# Patient Record
Sex: Male | Born: 1965 | Race: White | Hispanic: No | Marital: Married | Smoking: Never smoker
Health system: Southern US, Community
[De-identification: ages and names within clinical notes are randomized; demographics above are authoritative.]

## PROBLEM LIST (undated history)

## (undated) DIAGNOSIS — Z8489 Family history of other specified conditions: Secondary | ICD-10-CM

## (undated) DIAGNOSIS — Z8619 Personal history of other infectious and parasitic diseases: Secondary | ICD-10-CM

## (undated) DIAGNOSIS — K219 Gastro-esophageal reflux disease without esophagitis: Secondary | ICD-10-CM

## (undated) DIAGNOSIS — M1990 Unspecified osteoarthritis, unspecified site: Secondary | ICD-10-CM

## (undated) DIAGNOSIS — J45909 Unspecified asthma, uncomplicated: Secondary | ICD-10-CM

## (undated) HISTORY — PX: WISDOM TOOTH EXTRACTION: SHX21

## (undated) HISTORY — DX: Personal history of other infectious and parasitic diseases: Z86.19

## (undated) HISTORY — DX: Unspecified osteoarthritis, unspecified site: M19.90

---

## 2012-04-24 ENCOUNTER — Encounter: Payer: Self-pay | Admitting: Internal Medicine

## 2012-04-24 ENCOUNTER — Ambulatory Visit (INDEPENDENT_AMBULATORY_CARE_PROVIDER_SITE_OTHER): Payer: Managed Care, Other (non HMO) | Admitting: Internal Medicine

## 2012-04-24 VITALS — BP 100/68 | HR 69 | Temp 98.0°F | Resp 18 | Ht 76.25 in | Wt 208.0 lb

## 2012-04-24 DIAGNOSIS — Z809 Family history of malignant neoplasm, unspecified: Secondary | ICD-10-CM

## 2012-04-24 DIAGNOSIS — Z0000 Encounter for general adult medical examination without abnormal findings: Secondary | ICD-10-CM

## 2012-04-24 DIAGNOSIS — R109 Unspecified abdominal pain: Secondary | ICD-10-CM

## 2012-04-24 LAB — LIPID PANEL
Cholesterol: 180 mg/dL (ref 0–200)
LDL Cholesterol: 120 mg/dL — ABNORMAL HIGH (ref 0–99)
Total CHOL/HDL Ratio: 5.5 Ratio
Triglycerides: 134 mg/dL (ref <150)
VLDL: 27 mg/dL (ref 0–40)

## 2012-04-24 LAB — HEPATIC FUNCTION PANEL
ALT: 13 U/L (ref 0–53)
Bilirubin, Direct: 0.1 mg/dL (ref 0.0–0.3)
Indirect Bilirubin: 0.3 mg/dL (ref 0.0–0.9)
Total Bilirubin: 0.4 mg/dL (ref 0.3–1.2)

## 2012-04-24 LAB — BASIC METABOLIC PANEL
BUN: 13 mg/dL (ref 6–23)
Chloride: 106 mEq/L (ref 96–112)
Creat: 1.04 mg/dL (ref 0.50–1.35)
Potassium: 4.2 mEq/L (ref 3.5–5.3)

## 2012-04-24 LAB — CBC WITH DIFFERENTIAL/PLATELET
Basophils Absolute: 0.1 10*3/uL (ref 0.0–0.1)
Basophils Relative: 1 % (ref 0–1)
Eosinophils Relative: 3 % (ref 0–5)
Lymphocytes Relative: 30 % (ref 12–46)
MCHC: 33.4 g/dL (ref 30.0–36.0)
Neutro Abs: 3.6 10*3/uL (ref 1.7–7.7)
Platelets: 254 10*3/uL (ref 150–400)
RDW: 14 % (ref 11.5–15.5)
WBC: 6.7 10*3/uL (ref 4.0–10.5)

## 2012-04-25 LAB — URINALYSIS, ROUTINE W REFLEX MICROSCOPIC
Bilirubin Urine: NEGATIVE
Glucose, UA: NEGATIVE mg/dL
Hgb urine dipstick: NEGATIVE
Ketones, ur: NEGATIVE mg/dL
Protein, ur: NEGATIVE mg/dL
Urobilinogen, UA: 0.2 mg/dL (ref 0.0–1.0)

## 2012-05-05 DIAGNOSIS — Z809 Family history of malignant neoplasm, unspecified: Secondary | ICD-10-CM | POA: Insufficient documentation

## 2012-05-05 DIAGNOSIS — Z0000 Encounter for general adult medical examination without abnormal findings: Secondary | ICD-10-CM | POA: Insufficient documentation

## 2012-05-05 DIAGNOSIS — R109 Unspecified abdominal pain: Secondary | ICD-10-CM | POA: Insufficient documentation

## 2012-05-05 NOTE — Assessment & Plan Note (Signed)
Family hx suggestive of MEN?2. Close follow up recommended and will further discuss possible endocrinology consult.

## 2012-05-05 NOTE — Progress Notes (Signed)
  Subjective:    Patient ID: Ronald Rios, male    DOB: February 11, 1966, 46 y.o.   MRN: [id number]  HPI Pt presents to clinic for physical. Notes 2wk h/o left abdominal pain improved by lying down. No radiation, fever, chills, n/v or blood in stool. Has family h/o including mother with pheochromocytoma and sister with thyroid and parathyroid cancer.   Past Medical History  Diagnosis Date  . History of chicken pox     childhood   Past Surgical History  Procedure Date  . No past surgeries 04/24/2012    per patient report    reports that he has never smoked. He has never used smokeless tobacco. He reports that he drinks alcohol. He reports that he does not use illicit drugs. family history includes Cancer in an unspecified family member; Diabetes in his father; Hypertension in his mother; Multiple sclerosis in his sister; Prostate cancer (age of onset:78) in his father; and Stroke in his mother.  There is no history of Heart disease, and Colon cancer, and Breast cancer, . No Known Allergies   Review of Systems  Gastrointestinal: Positive for abdominal pain. Negative for nausea, vomiting, diarrhea, constipation, blood in stool and abdominal distention.  All other systems reviewed and are negative.       Objective:   Physical Exam  Physical Exam  Nursing note and vitals reviewed. Constitutional: He appears well-developed and well-nourished. No distress.  HENT:  Head: Normocephalic and atraumatic.  Right Ear: Tympanic membrane and external ear normal.  Left Ear: Tympanic membrane and external ear normal.  Nose: Nose normal.  Mouth/Throat: Uvula is midline, oropharynx is clear and moist and mucous membranes are normal. No oropharyngeal exudate.  Eyes: Conjunctivae and EOM are normal. Pupils are equal, round, and reactive to light. Right eye exhibits no discharge. Left eye exhibits no discharge. No scleral icterus.  Neck: Neck supple. Carotid bruit is not present. No thyromegaly present.    Cardiovascular: Normal rate, regular rhythm and normal heart sounds.  Exam reveals no gallop and no friction rub.   No murmur heard. Pulmonary/Chest: Effort normal and breath sounds normal. No respiratory distress. He has no wheezes. He has no rales.  Abdominal: Soft. He exhibits no distension and no mass. There is no hepatosplenomegaly. Mild tenderness of llq without rebound, guarding or rigidity. There is no rebound. Hernia confirmed negative in the right inguinal area and confirmed negative in the left inguinal area.  Genitourinary: Rectum normal, prostate normal and testes normal. Rectal exam shows no mass and no tenderness. Guaiac negative stool. Prostate is not enlarged and not tender.Lymphadenopathy:    He has no cervical adenopathy.  Neurological: He is alert.  Skin: Skin is warm and dry. He is not diaphoretic.  Psychiatric: He has a normal mood and affect.        Assessment & Plan:

## 2012-05-05 NOTE — Assessment & Plan Note (Signed)
Close f/u in 2wks or sooner if needed. If sx's persist pursue abd ct vs GI consult

## 2012-05-05 NOTE — Assessment & Plan Note (Signed)
Obtain cpe labs. Given hemoccult cards x3 to complete.

## 2012-07-12 ENCOUNTER — Encounter (HOSPITAL_BASED_OUTPATIENT_CLINIC_OR_DEPARTMENT_OTHER): Payer: Self-pay

## 2012-07-12 ENCOUNTER — Ambulatory Visit (INDEPENDENT_AMBULATORY_CARE_PROVIDER_SITE_OTHER): Payer: Managed Care, Other (non HMO) | Admitting: Internal Medicine

## 2012-07-12 ENCOUNTER — Ambulatory Visit (HOSPITAL_BASED_OUTPATIENT_CLINIC_OR_DEPARTMENT_OTHER)
Admission: RE | Admit: 2012-07-12 | Discharge: 2012-07-12 | Disposition: A | Discharge status: Home / Self Care | Payer: Managed Care, Other (non HMO) | Source: Ambulatory Visit | Attending: Internal Medicine | Admitting: Internal Medicine

## 2012-07-12 ENCOUNTER — Encounter: Payer: Self-pay | Admitting: Internal Medicine

## 2012-07-12 ENCOUNTER — Telehealth: Payer: Self-pay | Admitting: Gastroenterology

## 2012-07-12 VITALS — BP 119/77 | HR 68 | Temp 98.1°F | Resp 16 | Wt 208.2 lb

## 2012-07-12 DIAGNOSIS — R109 Unspecified abdominal pain: Secondary | ICD-10-CM

## 2012-07-12 DIAGNOSIS — R52 Pain, unspecified: Secondary | ICD-10-CM | POA: Insufficient documentation

## 2012-07-12 DIAGNOSIS — M47817 Spondylosis without myelopathy or radiculopathy, lumbosacral region: Secondary | ICD-10-CM | POA: Insufficient documentation

## 2012-07-12 DIAGNOSIS — R1032 Left lower quadrant pain: Secondary | ICD-10-CM | POA: Insufficient documentation

## 2012-07-12 DIAGNOSIS — M4120 Other idiopathic scoliosis, site unspecified: Secondary | ICD-10-CM | POA: Insufficient documentation

## 2012-07-12 MED ORDER — IOHEXOL 300 MG/ML  SOLN
100.0000 mL | Freq: Once | INTRAMUSCULAR | Status: AC | PRN
  Administered 2012-07-12: 100 mL via INTRAVENOUS

## 2012-07-12 NOTE — Telephone Encounter (Signed)
Ronald Rios states Dr Rodena Medin wants pt seen asap for llq pain; CT done today that was basically unremarkable. Pt given an appt with Willette Cluster, NP in am.

## 2012-07-12 NOTE — Assessment & Plan Note (Signed)
Proceed with abd/pelvic ct. Consider GI consult if imaging unrevealing. Present to ED with worsening pain.

## 2012-07-12 NOTE — Progress Notes (Signed)
  Subjective:    Patient ID: Ronald Rios, male    DOB: [birth year], 46 y.o.   MRN: [id number]  HPI Pt presents to clinic for evaluation of abdominal pain. Notes continued LLQ pain recently worsening in severity over last one week. No radiating pain. No associated n/v, blood in stool, f/c or change in bowel habits. No alleviating or exacerbating factors. Taking no medication for the problem.  Past Medical History  Diagnosis Date  . History of chicken pox     childhood   Past Surgical History  Procedure Date  . No past surgeries 04/24/2012    per patient report    reports that he has never smoked. He has never used smokeless tobacco. He reports that he drinks alcohol. He reports that he does not use illicit drugs. family history includes Cancer in an unspecified family member; Diabetes in his father; Hypertension in his mother; Multiple sclerosis in his sister; Prostate cancer (age of onset:78) in his father; and Stroke in his mother.  There is no history of Heart disease, and Colon cancer, and Breast cancer, . No Known Allergies   Review of Systems  See hpi     Objective:   Physical Exam  Nursing note and vitals reviewed. Constitutional: He appears well-developed and well-nourished. No distress.  HENT:  Head: Normocephalic and atraumatic.  Eyes: Conjunctivae are normal. No scleral icterus.  Abdominal: Soft. Normal appearance and bowel sounds are normal. He exhibits no distension and no mass. There is no hepatosplenomegaly. There is tenderness in the left lower quadrant. There is no rebound and no guarding.    Skin: He is not diaphoretic.          Assessment & Plan:

## 2012-07-13 ENCOUNTER — Ambulatory Visit (INDEPENDENT_AMBULATORY_CARE_PROVIDER_SITE_OTHER): Payer: Managed Care, Other (non HMO) | Admitting: Nurse Practitioner

## 2012-07-13 ENCOUNTER — Encounter: Payer: Self-pay | Admitting: Nurse Practitioner

## 2012-07-13 ENCOUNTER — Telehealth: Payer: Self-pay | Admitting: *Deleted

## 2012-07-13 VITALS — BP 120/70 | HR 64 | Ht 76.0 in | Wt 210.8 lb

## 2012-07-13 DIAGNOSIS — M7918 Myalgia, other site: Secondary | ICD-10-CM | POA: Insufficient documentation

## 2012-07-13 DIAGNOSIS — IMO0001 Reserved for inherently not codable concepts without codable children: Secondary | ICD-10-CM

## 2012-07-13 LAB — POC HEMOCCULT BLD/STL (HOME/3-CARD/SCREEN): Card #1 Date: NEGATIVE

## 2012-07-13 MED ORDER — METHOCARBAMOL 500 MG PO TABS: ORAL_TABLET | ORAL | Status: DC

## 2012-07-13 NOTE — Telephone Encounter (Signed)
i know he will be traveling so ok to cancel but stay in touch with status

## 2012-07-13 NOTE — Patient Instructions (Addendum)
We sent a prescription for Robaxin to [facility]. Use a heating pad 2-3 times a day.  Your stool card test was negative per Dr. Rodena Medin. You do not need a Colonoscopy at this time.

## 2012-07-13 NOTE — Addendum Note (Signed)
Addended by: Regis Bill on: 07/13/2012 10:25 AM   Modules accepted: Orders

## 2012-07-13 NOTE — Progress Notes (Signed)
07/13/2012 Ronald Rios [id number] 12-Apr-1966   HISTORY OF PRESENT ILLNESS: Patient is a 46 year old male, new to this practice, here for evaluation of left lower quadrant pain. Patient gives a 2 month history of left lower quadrant pain which is described as constant, dull in nature. It is worse with movements such as lifting left leg. Pain not related to meating. Patient sleeps well at night, the pain does not disturb him. Pain not related to bowel movements and his bowel movements are normal. No overt GI blood loss but patient tells me he just submitted hemoccult cards to PCP. No weight loss, no nausea or vomiting. No family history of colon cancer. Patient does have other cancers in his family however. No significant GERD symptoms only occasional heartburn.   Past Medical History  Diagnosis Date  . History of chicken pox     childhood  . Arthritis    Past Surgical History  Procedure Date  . No past surgeries 04/24/2012    per patient report    reports that he has never smoked. He has never used smokeless tobacco. He reports that he drinks alcohol. He reports that he does not use illicit drugs. family history includes Cancer in his mother and sister; Diabetes in his father; Hypertension in his mother; Multiple sclerosis in his sister; Prostate cancer (age of onset:78) in his father; and Stroke in his mother.  There is no history of Heart disease, and Colon cancer, and Breast cancer, . No Known Allergies    No outpatient encounter prescriptions on file as of 07/13/2012.   Facility-Administered Encounter Medications as of 07/13/2012  Medication Dose Route Frequency Provider Last Rate Last Dose  . iohexol (OMNIPAQUE) 300 MG/ML solution 100 mL  100 mL Intravenous Once PRN Medication Radiologist, MD   100 mL at 07/12/12 1349     REVIEW OF SYSTEMS  : All other systems reviewed and negative except where noted in the History of Present Illness.   PHYSICAL EXAM: BP 120/70  Pulse 64  Ht 6\' 4"   (1.93 m)  Wt 210 lb 12.8 oz (95.618 kg)  BMI 25.66 kg/m2 General: Well developed white male in no acute distress Head: Normocephalic and atraumatic Eyes:  sclerae anicteric,conjunctive pink. Ears: Normal auditory acuity Mouth: No deformity or lesions Neck: Supple, no masses.  Lungs: Clear throughout to auscultation Heart: Regular rate and rhythm; no murmurs heard Abdomen: Soft, non distended, nontender. Positive Carnett's sign. No masses or hepatomegaly noted. Normal Bowel sounds Rectal: not done. Hemoccults negative at PCP office Musculoskeletal: Symmetrical with no gross deformities  Skin: No lesions on visible extremities Extremities: No edema or deformities noted Neurological: Alert oriented x 4, grossly nonfocal Cervical Nodes:  No significant cervical adenopathy Psychological:  Alert and cooperative. Normal mood and affect  ASSESSMENT AND PLAN:  46 year old male with a two-month history of left lower quadrant pain, worse over the last couple of weeks. Pain dull but constant during waking hours. Pain definitely exacerbated by physical activity involving LLQ, especially lifting left lower extremity. He has a positive Carnett's sign on exam. No alarm features such as bowel changes, blood in stool, anemia or weight loss. We called PCP's office, his Hemoccults are negative.   Will try a course of muscle relaxers and heating pad. Of note a CT scan of the abdomen and pelvis with contrast done for evaluation of this pain was negative. Patient will let us know in a few days how he is doing.

## 2012-07-13 NOTE — Telephone Encounter (Signed)
Notified pt of normal CT result. He reports that he saw GI this morning and they determined that he has a muscular tear in his abdomen and have advised him to take it easy for a while. Pt wants to know if he needs to keep his follow up with [place] on 07/20/12? Please advise.

## 2012-07-13 NOTE — Addendum Note (Signed)
Addended by: Regis Bill on: 07/13/2012 10:27 AM   Modules accepted: Orders

## 2012-07-16 ENCOUNTER — Encounter: Payer: Self-pay | Admitting: Nurse Practitioner

## 2012-07-16 NOTE — Telephone Encounter (Signed)
Notified pt and voices understanding. States he will let us know if he is not feeling better within 1 week.

## 2012-07-18 NOTE — Progress Notes (Signed)
Agree with Ms. Guenther's assessment and plan. Srija Southard E. Viola Placeres, MD, FACG   

## 2012-07-20 ENCOUNTER — Ambulatory Visit: Payer: Managed Care, Other (non HMO) | Admitting: Internal Medicine

## 2012-08-09 ENCOUNTER — Ambulatory Visit: Payer: Managed Care, Other (non HMO) | Admitting: Gastroenterology

## 2013-05-11 ENCOUNTER — Encounter (HOSPITAL_COMMUNITY): Payer: Self-pay | Admitting: Physical Medicine and Rehabilitation

## 2013-05-11 ENCOUNTER — Emergency Department (HOSPITAL_COMMUNITY): Payer: Managed Care, Other (non HMO)

## 2013-05-11 ENCOUNTER — Emergency Department (HOSPITAL_COMMUNITY)
Admission: EM | Admit: 2013-05-11 | Discharge: 2013-05-11 | Disposition: A | Discharge status: Home / Self Care | Payer: Managed Care, Other (non HMO) | Attending: Emergency Medicine | Admitting: Emergency Medicine

## 2013-05-11 DIAGNOSIS — T07XXXA Unspecified multiple injuries, initial encounter: Secondary | ICD-10-CM | POA: Insufficient documentation

## 2013-05-11 DIAGNOSIS — Y998 Other external cause status: Secondary | ICD-10-CM | POA: Insufficient documentation

## 2013-05-11 DIAGNOSIS — S4980XA Other specified injuries of shoulder and upper arm, unspecified arm, initial encounter: Secondary | ICD-10-CM | POA: Insufficient documentation

## 2013-05-11 DIAGNOSIS — Y939 Activity, unspecified: Secondary | ICD-10-CM | POA: Insufficient documentation

## 2013-05-11 DIAGNOSIS — Y9289 Other specified places as the place of occurrence of the external cause: Secondary | ICD-10-CM | POA: Insufficient documentation

## 2013-05-11 DIAGNOSIS — S4991XA Unspecified injury of right shoulder and upper arm, initial encounter: Secondary | ICD-10-CM

## 2013-05-11 DIAGNOSIS — Z791 Long term (current) use of non-steroidal anti-inflammatories (NSAID): Secondary | ICD-10-CM | POA: Insufficient documentation

## 2013-05-11 DIAGNOSIS — Z79899 Other long term (current) drug therapy: Secondary | ICD-10-CM | POA: Insufficient documentation

## 2013-05-11 DIAGNOSIS — S46909A Unspecified injury of unspecified muscle, fascia and tendon at shoulder and upper arm level, unspecified arm, initial encounter: Secondary | ICD-10-CM | POA: Insufficient documentation

## 2013-05-11 MED ORDER — HYDROCODONE-ACETAMINOPHEN 5-325 MG PO TABS: 2.0000 | ORAL_TABLET | ORAL | Status: DC | PRN

## 2013-05-11 MED ORDER — KETOROLAC TROMETHAMINE 60 MG/2ML IM SOLN
60.0000 mg | Freq: Once | INTRAMUSCULAR | Status: AC
  Administered 2013-05-11: 60 mg via INTRAMUSCULAR
  Filled 2013-05-11: qty 2

## 2013-05-11 MED ORDER — NAPROXEN 500 MG PO TABS: 500.0000 mg | ORAL_TABLET | Freq: Two times a day (BID) | ORAL | Status: DC

## 2013-05-11 MED ORDER — PROMETHAZINE HCL 25 MG PO TABS: 25.0000 mg | ORAL_TABLET | Freq: Four times a day (QID) | ORAL | Status: DC | PRN
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
## 2013-05-11 NOTE — ED Notes (Addendum)
Report received, assumed care. Pt presently in X ray

## 2013-05-11 NOTE — ED Notes (Signed)
Patient transported to X-ray 
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
## 2013-10-21 ENCOUNTER — Ambulatory Visit (INDEPENDENT_AMBULATORY_CARE_PROVIDER_SITE_OTHER): Payer: Managed Care, Other (non HMO) | Admitting: Physician Assistant

## 2013-10-21 ENCOUNTER — Encounter: Payer: Self-pay | Admitting: Physician Assistant

## 2013-10-21 ENCOUNTER — Other Ambulatory Visit: Payer: Self-pay | Admitting: Physician Assistant

## 2013-10-21 VITALS — BP 118/86 | HR 80 | Temp 98.2°F | Resp 16 | Ht 76.0 in | Wt 215.0 lb

## 2013-10-21 DIAGNOSIS — W57XXXA Bitten or stung by nonvenomous insect and other nonvenomous arthropods, initial encounter: Secondary | ICD-10-CM | POA: Insufficient documentation

## 2013-10-21 DIAGNOSIS — T148 Other injury of unspecified body region: Secondary | ICD-10-CM

## 2013-10-21 NOTE — Progress Notes (Signed)
Pre visit review using our clinic review tool, if applicable. No additional management support is needed unless otherwise documented below in the visit note/SLS  

## 2013-10-21 NOTE — Assessment & Plan Note (Signed)
Questionable tick bite.  No tick was removed from body. Physical exam within normal limits.  I do not have strong suspicion for rickettsial infection.  Giving patient's concern and request, will obtain test for Lyme disease and RMSF.  Will treat accordingly if labs abnormal.
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
## 2013-10-21 NOTE — Patient Instructions (Signed)
Please go to lab.  I will call you with your results.  We will start medication if needed.  Please read information below in Lyme disease.

## 2013-10-22 LAB — LYME ABY, WSTRN BLT IGG & IGM W/BANDS
B burgdorferi IgG Abs (IB): NEGATIVE
B burgdorferi IgM Abs (IB): NEGATIVE
Lyme Disease 18 kD IgG: NONREACTIVE
Lyme Disease 23 kD IgG: NONREACTIVE
Lyme Disease 23 kD IgM: NONREACTIVE
Lyme Disease 28 kD IgG: NONREACTIVE
Lyme Disease 30 kD IgG: NONREACTIVE
Lyme Disease 39 kD IgG: NONREACTIVE
Lyme Disease 39 kD IgM: NONREACTIVE
Lyme Disease 41 kD IgM: NONREACTIVE
Lyme Disease 58 kD IgG: NONREACTIVE

## 2013-10-22 LAB — ROCKY MTN SPOTTED FVR ABS PNL(IGG+IGM)
RMSF IgG: 0.25 IV
RMSF IgM: 0.3 IV

## 2014-06-23 ENCOUNTER — Ambulatory Visit (INDEPENDENT_AMBULATORY_CARE_PROVIDER_SITE_OTHER): Payer: Managed Care, Other (non HMO) | Admitting: Physician Assistant

## 2014-06-23 ENCOUNTER — Encounter: Payer: Self-pay | Admitting: Physician Assistant

## 2014-06-23 VITALS — BP 122/68 | HR 67 | Temp 98.5°F | Resp 16 | Ht 76.0 in | Wt 215.1 lb

## 2014-06-23 DIAGNOSIS — K219 Gastro-esophageal reflux disease without esophagitis: Secondary | ICD-10-CM

## 2014-06-23 DIAGNOSIS — K2100 Gastro-esophageal reflux disease with esophagitis, without bleeding: Secondary | ICD-10-CM

## 2014-06-23 LAB — CBC
HEMATOCRIT: 44.1 % (ref 39.0–52.0)
HEMOGLOBIN: 14.6 g/dL (ref 13.0–17.0)
MCHC: 33.1 g/dL (ref 30.0–36.0)
MCV: 83.8 fl (ref 78.0–100.0)
Platelets: 263 10*3/uL (ref 150.0–400.0)
RBC: 5.26 Mil/uL (ref 4.22–5.81)
RDW: 13.6 % (ref 11.5–15.5)
WBC: 6.7 10*3/uL (ref 4.0–10.5)

## 2014-06-23 LAB — COMPREHENSIVE METABOLIC PANEL
ALT: 13 U/L (ref 0–53)
AST: 19 U/L (ref 0–37)
Albumin: 4.4 g/dL (ref 3.5–5.2)
Alkaline Phosphatase: 50 U/L (ref 39–117)
BILIRUBIN TOTAL: 1.1 mg/dL (ref 0.2–1.2)
BUN: 17 mg/dL (ref 6–23)
CALCIUM: 9.2 mg/dL (ref 8.4–10.5)
CHLORIDE: 102 meq/L (ref 96–112)
CO2: 27 meq/L (ref 19–32)
CREATININE: 1.1 mg/dL (ref 0.4–1.5)
GFR: 75.27 mL/min (ref >60.00)
GLUCOSE: 93 mg/dL (ref 70–99)
Potassium: 3.8 mEq/L (ref 3.5–5.1)
SODIUM: 139 meq/L (ref 135–145)
TOTAL PROTEIN: 7.6 g/dL (ref 6.0–8.3)

## 2014-06-23 LAB — LIPASE: LIPASE: 40 U/L (ref 11.0–59.0)

## 2014-06-23 LAB — H. PYLORI ANTIBODY, IGG: H Pylori IgG: NEGATIVE

## 2014-06-23 MED ORDER — ESOMEPRAZOLE MAGNESIUM 20 MG PO CPDR: 20.0000 mg | DELAYED_RELEASE_CAPSULE | Freq: Every day | ORAL | Status: DC

## 2014-06-23 NOTE — Patient Instructions (Signed)
Please take nexium daily as directed.  Avoid late-night eating or heavy foods.  Avoid Ibuprofen-containing products.  Avoid alcohol consumption.  Follow-up with me in 2 weeks.  I recommend you schedule a complete physical at that time.  Return to clinic sooner if symptoms recur.   Food Choices for Gastroesophageal Reflux Disease When you have gastroesophageal reflux disease (GERD), the foods you eat and your eating habits are very important. Choosing the right foods can help ease the discomfort of GERD. WHAT GENERAL GUIDELINES DO I NEED TO FOLLOW?  Choose fruits, vegetables, whole grains, low-fat dairy products, and low-fat meat, fish, and poultry.  Limit fats such as oils, salad dressings, butter, nuts, and avocado.  Keep a food diary to identify foods that cause symptoms.  Avoid foods that cause reflux. These may be different for different people.  Eat frequent small meals instead of three large meals each day.  Eat your meals slowly, in a relaxed setting.  Limit fried foods.  Cook foods using methods other than frying.  Avoid drinking alcohol.  Avoid drinking large amounts of liquids with your meals.  Avoid bending over or lying down until 2-3 hours after eating. WHAT FOODS ARE NOT RECOMMENDED? The following are some foods and drinks that may worsen your symptoms: Vegetables Tomatoes. Tomato juice. Tomato and spaghetti sauce. Chili peppers. Onion and garlic. Horseradish. Fruits Oranges, grapefruit, and lemon (fruit and juice). Meats High-fat meats, fish, and poultry. This includes hot dogs, ribs, ham, sausage, salami, and bacon. Dairy Whole milk and chocolate milk. Sour cream. Cream. Butter. Ice cream. Cream cheese.  Beverages Coffee and tea, with or without caffeine. Carbonated beverages or energy drinks. Condiments Hot sauce. Barbecue sauce.  Sweets/Desserts Chocolate and cocoa. Donuts. Peppermint and spearmint. Fats and Oils High-fat foods, including JamaicaFrench fries and  potato chips. Other Vinegar. Strong spices, such as black pepper, white pepper, red pepper, cayenne, curry powder, cloves, ginger, and chili powder. The items listed above may not be a complete list of foods and beverages to avoid. Contact your dietitian for more information. Document Released: 11/21/2005 Document Revised: 11/26/2013 Document Reviewed: 09/25/2013 [facility] Patient Information 2015 [place], [place]. This information is not intended to replace advice given to you by your health care provider. Make sure you discuss any questions you have with your health care provider.

## 2014-06-23 NOTE — Progress Notes (Signed)
Patient presents to clinic today to transfer care.  Acute Concerns: Patient complains of esophageal pain and discomfort with eating solid foods that occurred one week ago. Endorses pain with eating that has improved gradually each day until fully resolving yesterday.  Denies nausea or vomiting.  Denies abdominal pain, change to bowel habits, tenesmus, melena or hematochezia. Endorses history of acid reflux with intermittent heart burn. Endorses occasional dry cough.  Denies recent use of alcohol or NSAID.  Denies hx of ulcer.  Has been tolerating liquids and PO foods today without pain.   Past Medical History  Diagnosis Date  . History of chicken pox     childhood  . Arthritis     Past Surgical History  Procedure Laterality Date  . No past surgeries  04/24/2012    per patient report    No current outpatient prescriptions on file prior to visit.   No current facility-administered medications on file prior to visit.    No Known Allergies  Family History  Problem Relation Age of Onset  . Heart disease Neg Hx   . Colon cancer Neg Hx   . Breast cancer Neg Hx   . Prostate cancer Father [id number]  . Diabetes Father   . Hypertension Mother   . Cancer Mother     pheochromocytoma  . Stroke Mother   . Multiple sclerosis Sister   . Cancer Sister     pheochromocytoma    History   Social History  . Marital Status: Married    Spouse Name: N/A    Number of Children: N/A  . Years of Education: N/A   Occupational History  . Not on file.   Social History Main Topics  . Smoking status: Never Smoker   . Smokeless tobacco: Never Used  . Alcohol Use: No  . Drug Use: No  . Sexual Activity: Not on file   Other Topics Concern  . Not on file   Social History Narrative   Caffeine daily   ROS See HPI.  Other ROS are negative.   BP 122/68  Pulse 67  Temp(Src) 98.5 F (36.9 C) (Oral)  Resp 16  Ht 6\' 4"  (1.93 m)  Wt 215 lb 2 oz (97.58 kg)  BMI 26.20 kg/m2  SpO2 97%  Physical  Exam  Vitals reviewed. Constitutional: He is well-developed, well-nourished, and in no distress.  HENT:  Head: Normocephalic and atraumatic.  Right Ear: External ear normal.  Left Ear: External ear normal.  Nose: Nose normal.  Mouth/Throat: No oropharyngeal exudate.  Eyes: Conjunctivae are normal.  Neck: Neck supple.  Cardiovascular: Normal rate, regular rhythm, normal heart sounds and intact distal pulses.   Pulmonary/Chest: Effort normal and breath sounds normal. No respiratory distress. He has no wheezes. He has no rales. He exhibits no tenderness.  Abdominal: Soft. Bowel sounds are normal. He exhibits no distension and no mass. There is no tenderness. There is no rebound and no guarding.  Lymphadenopathy:    He has no cervical adenopathy.  Skin: Skin is warm and dry. No rash noted.  Psychiatric: Affect normal.   Assessment/Plan: Reflux esophagitis Resolving.  Rx Daily Nexium for 2 weeks.  Avoid trigger foods.  Avoid NSAIDs or alcohol consumption.  Will obtain CBC, CMP, lipase and H. Pylori.  If symptoms recur, patient will need GI workup and EGD.  Esophageal reflux Rx daily Nexium.  Avoid trigger foods and late-night eating.  Elevate HOB. Follow-up in 2 weeks.

## 2014-06-23 NOTE — Progress Notes (Signed)
Pre visit review using our clinic review tool, if applicable. No additional management support is needed unless otherwise documented below in the visit note/SLS  

## 2014-06-23 NOTE — Assessment & Plan Note (Signed)
Resolving.  Rx Daily Nexium for 2 weeks.  Avoid trigger foods.  Avoid NSAIDs or alcohol consumption.  Will obtain CBC, CMP, lipase and H. Pylori.  If symptoms recur, patient will need GI workup and EGD.

## 2014-06-23 NOTE — Assessment & Plan Note (Signed)
Rx daily Nexium.  Avoid trigger foods and late-night eating.  Elevate HOB. Follow-up in 2 weeks.

## 2014-06-24 ENCOUNTER — Telehealth: Payer: Self-pay

## 2014-06-24 NOTE — Telephone Encounter (Signed)
Pt was contacted and results were given. Pt was also advised to RTC  Within 2 weeks. Pt voiced understanding of this.

## 2014-06-26 ENCOUNTER — Telehealth: Payer: Self-pay | Admitting: *Deleted

## 2014-06-26 DIAGNOSIS — K219 Gastro-esophageal reflux disease without esophagitis: Secondary | ICD-10-CM

## 2014-06-26 NOTE — Telephone Encounter (Signed)
Fax received from pharmacy requesting Drug Change d/t Nexium not covered by pt's Insurance/SLS Please Advise.

## 2014-06-27 MED ORDER — OMEPRAZOLE 20 MG PO CPDR: 20.0000 mg | DELAYED_RELEASE_CAPSULE | Freq: Every day | ORAL | Status: DC

## 2014-06-27 NOTE — Telephone Encounter (Signed)
Left message on machine.

## 2014-06-27 NOTE — Telephone Encounter (Signed)
Rx Prilosec instead.

## 2014-07-18 ENCOUNTER — Ambulatory Visit: Payer: Managed Care, Other (non HMO) | Admitting: Physician Assistant

## 2014-09-29 ENCOUNTER — Telehealth: Payer: Self-pay | Admitting: Physician Assistant

## 2014-09-29 NOTE — Telephone Encounter (Signed)
Patient needs appointment for surgical clearance so he can be examined and labs can be performed, although the labs listed below such as ESR and respiratory culture are not usually for surgical clearance. Please call patient to clarify what procedure he is having and to schedule appointment for him to be cleared.

## 2014-09-29 NOTE — Telephone Encounter (Signed)
Caller name: Alred,Lisa Relation to pt: self  Call back number:[phone]   Reason for call:   Pt is having surgery and has a RX requiring blood work for just to a new a few an upper respartory culture, CBC differtenal, ESR, CRP , GLUCOSE...Marland KitchenMarland KitchenMarland Kitchenplease advise

## 2014-09-30 NOTE — Telephone Encounter (Signed)
Caller informed of provider instructions; scheduled OV Fri, 10.30.15 at 4:00p and will bring all paperwork related to Sx and needed labs/SLS

## 2014-10-03 ENCOUNTER — Ambulatory Visit (INDEPENDENT_AMBULATORY_CARE_PROVIDER_SITE_OTHER): Payer: Managed Care, Other (non HMO) | Admitting: Physician Assistant

## 2014-10-03 ENCOUNTER — Encounter: Payer: Self-pay | Admitting: Physician Assistant

## 2014-10-03 ENCOUNTER — Telehealth: Payer: Self-pay | Admitting: Physician Assistant

## 2014-10-03 VITALS — BP 122/68 | HR 75 | Temp 98.6°F | Resp 16 | Ht 76.0 in | Wt 222.2 lb

## 2014-10-03 DIAGNOSIS — Z01818 Encounter for other preprocedural examination: Secondary | ICD-10-CM

## 2014-10-03 DIAGNOSIS — Z136 Encounter for screening for cardiovascular disorders: Secondary | ICD-10-CM

## 2014-10-03 NOTE — Telephone Encounter (Signed)
Caller name: Donnamarie PoagJeanne  Relation to pt: [facility]  Call back number: [phone]   Reason for call:  [facility] advised [facility] do not do upper respiratory culture. Pt needs to be advised were to go.

## 2014-10-03 NOTE — Progress Notes (Signed)
    Patient presents to clinic today [facility] medical [facility].  Patient presents to clinic with lab orders from the surgeon[place]  Will also need EKG today.  Patient denies acute concerns at today's visit.  Past Medical History  Diagnosis Date  . History of chicken pox     childhood  . Arthritis     No current outpatient prescriptions on file prior to visit.   No current facility-administered medications on file prior to visit.    No Known Allergies  Family History  Problem Relation Age of Onset  . Heart disease Neg Hx   . Colon cancer Neg Hx   . Breast cancer Neg Hx   . Prostate cancer Father [id number]  . Diabetes Father   . Hypertension Mother   . Cancer Mother     pheochromocytoma  . Stroke Mother   . Multiple sclerosis Sister   . Cancer Sister     pheochromocytoma    History   Social History  . Marital Status: Married    Spouse Name: N/A    Number of Children: N/A  . Years of Education: N/A   Social History Main Topics  . Smoking status: Never Smoker   . Smokeless tobacco: Never Used  . Alcohol Use: No  . Drug Use: No  . Sexual Activity: None   Other Topics Concern  . None   Social History Narrative   Caffeine daily   Review of Systems - See HPI.  All other ROS are negative.  BP 122/68 mmHg  Pulse 75  Temp(Src) 98.6 F (37 C) (Oral)  Resp 16  Ht 6\' 4"  (1.93 m)  Wt 222 lb 4 oz (100.812 kg)  BMI 27.06 kg/m2  SpO2 99%  Physical Exam  Constitutional: He is oriented to person, place, and time and well-developed, well-nourished, and in no distress.  HENT:  Head: Normocephalic and atraumatic.  Right Ear: External ear normal.  Left Ear: External ear normal.  Nose: Nose normal.  Mouth/Throat: Oropharynx is clear and moist. No oropharyngeal exudate.  TM within normal limits.  Eyes: Conjunctivae are normal. Pupils are equal, round, and reactive to light.  Neck: Neck supple. No  thyromegaly present.  Cardiovascular: Normal rate, regular rhythm, normal heart sounds and intact distal pulses.   Pulmonary/Chest: Effort normal and breath sounds normal. No respiratory distress. He has no wheezes. He has no rales. He exhibits no tenderness.  Musculoskeletal:       Arms: Lymphadenopathy:    He has no cervical adenopathy.  Neurological: He is alert and oriented to person, place, and time.  Skin: Skin is warm and dry. No rash noted.  Psychiatric: Affect normal.  Vitals reviewed.  Assessment/Plan: Preoperative clearance EKG reveals NSR.  Exam looking good overall except for R shoulder with evidence of AC separation for which the patient has scheduled surgery. Vital signs within normal limits. [facility] from [facility].  Patient to send us a copy of results so we can help fax to his surgeon.

## 2014-10-03 NOTE — Telephone Encounter (Signed)
This is something that is typically obtained in the hospital or surgical center prior to surgery.  I am unsure as to what laboratories would perform this lab. I will speak with the main [facility] office to find out where this can be done.

## 2014-10-03 NOTE — Progress Notes (Signed)
Pre visit review using our clinic review tool, if applicable. No additional management support is needed unless otherwise documented below in the visit note/SLS  

## 2014-10-05 DIAGNOSIS — Z01818 Encounter for other preprocedural examination: Secondary | ICD-10-CM | POA: Insufficient documentation

## 2014-10-05 HISTORY — PX: SHOULDER SURGERY: SHX246

## 2014-10-05 NOTE — Assessment & Plan Note (Signed)
EKG reveals NSR.  Exam looking good overall except for R shoulder with evidence of AC separation for which the patient has scheduled surgery. Vital signs within normal limits. [facility].  Patient to send us a copy of results so we can help fax to his surgeon.

## 2014-10-06 NOTE — Telephone Encounter (Signed)
Spoke with patient concerning MRSA swab.  Had found nearby [facility] site that performs this.  Patient states he found an Urgent Care this weekend that obtained the swab.  Nothing further needed at present.

## 2015-02-28 ENCOUNTER — Emergency Department (HOSPITAL_COMMUNITY): Payer: Managed Care, Other (non HMO)

## 2015-02-28 ENCOUNTER — Encounter (HOSPITAL_COMMUNITY): Payer: Self-pay | Admitting: Emergency Medicine

## 2015-02-28 ENCOUNTER — Emergency Department (HOSPITAL_COMMUNITY)
Admission: EM | Admit: 2015-02-28 | Discharge: 2015-02-28 | Disposition: A | Discharge status: Home / Self Care | Payer: Managed Care, Other (non HMO) | Attending: Emergency Medicine | Admitting: Emergency Medicine

## 2015-02-28 DIAGNOSIS — Y999 Unspecified external cause status: Secondary | ICD-10-CM | POA: Diagnosis not present

## 2015-02-28 DIAGNOSIS — S42002A Fracture of unspecified part of left clavicle, initial encounter for closed fracture: Secondary | ICD-10-CM | POA: Diagnosis not present

## 2015-02-28 DIAGNOSIS — S4992XA Unspecified injury of left shoulder and upper arm, initial encounter: Secondary | ICD-10-CM | POA: Diagnosis present

## 2015-02-28 DIAGNOSIS — Y939 Activity, unspecified: Secondary | ICD-10-CM | POA: Diagnosis not present

## 2015-02-28 DIAGNOSIS — Y929 Unspecified place or not applicable: Secondary | ICD-10-CM | POA: Diagnosis not present

## 2015-02-28 DIAGNOSIS — Z8619 Personal history of other infectious and parasitic diseases: Secondary | ICD-10-CM | POA: Diagnosis not present

## 2015-02-28 DIAGNOSIS — Z79899 Other long term (current) drug therapy: Secondary | ICD-10-CM | POA: Diagnosis not present

## 2015-02-28 DIAGNOSIS — Z8739 Personal history of other diseases of the musculoskeletal system and connective tissue: Secondary | ICD-10-CM | POA: Insufficient documentation

## 2015-02-28 MED ORDER — HYDROMORPHONE HCL 1 MG/ML IJ SOLN
1.0000 mg | Freq: Once | INTRAMUSCULAR | Status: AC
  Administered 2015-02-28: 1 mg via INTRAMUSCULAR
  Filled 2015-02-28: qty 1

## 2015-02-28 MED ORDER — OXYCODONE-ACETAMINOPHEN 5-325 MG PO TABS: 1.0000 | ORAL_TABLET | ORAL | Status: DC | PRN
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
## 2015-02-28 NOTE — Discharge Instructions (Signed)
Clavicle Fracture °The clavicle, also called the collarbone, is the long bone that connects your shoulder to your rib cage. You can feel your collarbone at the top of your shoulders and rib cage. A clavicle fracture is a broken clavicle. It is a common injury that can happen at any age.  °CAUSES °Common causes of a clavicle fracture include: °· A direct blow to your shoulder. °· A car accident. °· A fall, especially if you try to break your fall with an outstretched arm. °RISK FACTORS °You may be at increased risk if: °· You are younger than 25 years or older than 75 years. Most clavicle fractures happen to people who are younger than 25 years. °· You are a male. °· You play contact sports. °SIGNS AND SYMPTOMS °A fractured clavicle is painful. It also makes it hard to move your arm. Other signs and symptoms may include: °· A shoulder that drops downward and forward. °· Pain when trying to lift your shoulder. °· Bruising, swelling, and tenderness over your clavicle. °· A grinding noise when you try to move your shoulder. °· A bump over your clavicle. °DIAGNOSIS °Your health care provider can usually diagnose a clavicle fracture by asking about your injury and examining your shoulder and clavicle. He or she may take an X-ray to determine the position of your clavicle. °TREATMENT °Treatment depends on the position of your clavicle after the fracture: °· If the broken ends of the bone are not out of place, your health care provider may put your arm in a sling or wrap a support bandage around your chest (figure-of-eight wrap). °· If the broken ends of the bone are out of place, you may need surgery. Surgery may involve placing screws, pins, or plates to keep your clavicle stable while it heals. Healing may take about 3 months. °When your health care provider thinks your fracture has healed enough, you may have to do physical therapy to regain normal movement and build up your arm strength. °HOME CARE INSTRUCTIONS   °· Apply ice to the injured area: °¨ Put ice in a plastic bag. °¨ Place a towel between your skin and the bag. °¨ Leave the ice on for 20 minutes, 2-3 times a day. °· If you have a wrap or splint: °¨ Wear it all the time, and remove it only to take a bath or shower. °¨ When you bathe or shower, keep your shoulder in the same position as when the sling or wrap is on. °¨ Do not lift your arm. °· If you have a figure-of-eight wrap: °¨ Another person must tighten it every day. °¨ It should be tight enough to hold your shoulders back. °¨ Allow enough room to place your index finger between your body and the strap. °¨ Loosen the wrap immediately if you feel numbness or tingling in your hands. °· Only take medicines as directed by your health care provider. °· Avoid activities that make the injury or pain worse for 4-6 weeks after surgery. °· Keep all follow-up appointments. °SEEK MEDICAL CARE IF:  °Your medicine is not helping to relieve pain and swelling. °SEEK IMMEDIATE MEDICAL CARE IF:  °Your arm is numb, cold, or pale, even when the splint is loose. °MAKE SURE YOU:  °· Understand these instructions. °· Will watch your condition. °· Will get help right away if you are not doing well or get worse. °Document Released: 08/31/2005 Document Revised: 11/26/2013 Document Reviewed: 10/14/2013 °ExitCare® Patient Information ©2015 [employer], LLC. This information is   not intended to replace advice given to you by your health care provider. Make sure you discuss any questions you have with your health care provider. ° °

## 2015-02-28 NOTE — ED Notes (Signed)
Pt stable, ambulatory, pain decreased to 4/10, wife at bedside.

## 2015-03-02 ENCOUNTER — Other Ambulatory Visit (HOSPITAL_COMMUNITY): Payer: Self-pay | Admitting: Orthopaedic Surgery

## 2015-03-04 ENCOUNTER — Encounter (HOSPITAL_COMMUNITY)
Admission: RE | Admit: 2015-03-04 | Discharge: 2015-03-04 | Disposition: A | Discharge status: Home / Self Care | Payer: Managed Care, Other (non HMO) | Source: Ambulatory Visit | Attending: Orthopaedic Surgery | Admitting: Orthopaedic Surgery

## 2015-03-04 ENCOUNTER — Other Ambulatory Visit (HOSPITAL_COMMUNITY): Payer: Self-pay | Admitting: *Deleted

## 2015-03-04 ENCOUNTER — Encounter (HOSPITAL_COMMUNITY): Payer: Self-pay

## 2015-03-04 DIAGNOSIS — J45909 Unspecified asthma, uncomplicated: Secondary | ICD-10-CM | POA: Diagnosis not present

## 2015-03-04 DIAGNOSIS — Y999 Unspecified external cause status: Secondary | ICD-10-CM | POA: Diagnosis not present

## 2015-03-04 DIAGNOSIS — X58XXXA Exposure to other specified factors, initial encounter: Secondary | ICD-10-CM | POA: Diagnosis not present

## 2015-03-04 DIAGNOSIS — Z79899 Other long term (current) drug therapy: Secondary | ICD-10-CM | POA: Diagnosis not present

## 2015-03-04 DIAGNOSIS — F17210 Nicotine dependence, cigarettes, uncomplicated: Secondary | ICD-10-CM | POA: Diagnosis not present

## 2015-03-04 DIAGNOSIS — K219 Gastro-esophageal reflux disease without esophagitis: Secondary | ICD-10-CM | POA: Diagnosis not present

## 2015-03-04 DIAGNOSIS — Y929 Unspecified place or not applicable: Secondary | ICD-10-CM | POA: Diagnosis not present

## 2015-03-04 DIAGNOSIS — M1990 Unspecified osteoarthritis, unspecified site: Secondary | ICD-10-CM | POA: Diagnosis not present

## 2015-03-04 DIAGNOSIS — Y939 Activity, unspecified: Secondary | ICD-10-CM | POA: Diagnosis not present

## 2015-03-04 DIAGNOSIS — S42002A Fracture of unspecified part of left clavicle, initial encounter for closed fracture: Secondary | ICD-10-CM | POA: Diagnosis present

## 2015-03-04 DIAGNOSIS — Z79891 Long term (current) use of opiate analgesic: Secondary | ICD-10-CM | POA: Diagnosis not present

## 2015-03-04 HISTORY — DX: Family history of other specified conditions: Z84.89

## 2015-03-04 HISTORY — DX: Unspecified asthma, uncomplicated: J45.909

## 2015-03-04 HISTORY — DX: Gastro-esophageal reflux disease without esophagitis: K21.9

## 2015-03-04 LAB — CBC
HCT: 44.2 % (ref 39.0–52.0)
Hemoglobin: 14.7 g/dL (ref 13.0–17.0)
MCH: 27.4 pg (ref 26.0–34.0)
MCHC: 33.3 g/dL (ref 30.0–36.0)
MCV: 82.5 fL (ref 78.0–100.0)
PLATELETS: 284 10*3/uL (ref 150–400)
RBC: 5.36 MIL/uL (ref 4.22–5.81)
RDW: 14 % (ref 11.5–15.5)
WBC: 6.7 10*3/uL (ref 4.0–10.5)

## 2015-03-04 NOTE — Pre-Procedure Instructions (Signed)
Ronald Rios  03/04/2015   Your procedure is scheduled on:  Friday, March 06, 2015 at 8:30 AM.   [facility] at 6:30 AM.   Call this number if you have problems the morning of surgery: [phone]               Any questions prior to day of surgery, please call [phone] between 8 & 4 PM.   Remember:   Do not eat food or drink liquids after midnight Thursday, 03/05/15.   Take these medicines the morning of surgery with A SIP OF WATER: Nexium.  Oxycodone (Percocet) - if needed  Stop Vitamins as of today.   Do not wear jewelry.  Do not wear lotions, powders, or colgone. You may NOT wear deodorant.  Men may shave face and neck.  Do not bring valuables to the hospital.  [facility]                  for any belongings or valuables.               Contacts, dentures or bridgework may not be worn into surgery.  Leave suitcase in the car. After surgery it may be brought to your room.  For patients admitted to the hospital, discharge time is determined by your                treatment team.               Patients discharged the day of surgery will not be allowed to drive home.    Special Instructions: [place] - Preparing for Surgery  Before surgery, you can play an important role.  Because skin is not sterile, your skin needs to be as free of germs as possible.  You can reduce the number of germs on you skin by washing with CHG (chlorahexidine gluconate) soap before surgery.  CHG is an antiseptic cleaner which kills germs and bonds with the skin to continue killing germs even after washing.  Please DO NOT use if you have an allergy to CHG or antibacterial soaps.  If your skin becomes reddened/irritated stop using the CHG and inform your nurse when you arrive at Short Stay.  Do not shave (including legs and underarms) for at least 48 hours prior to the first CHG shower.  You may shave your face.  Please follow these  instructions carefully:   1.  Shower with CHG Soap the night before surgery and the                                morning of Surgery.  2.  If you choose to wash your hair, wash your hair first as usual with your       normal shampoo.  3.  After you shampoo, rinse your hair and body thoroughly to remove the                      Shampoo.  4.  Use CHG as you would any other liquid soap.  You can apply chg directly       to the skin and wash gently with scrungie or a clean washcloth.  5.  Apply the CHG Soap to your body ONLY FROM THE NECK DOWN.        Do not use on open wounds or open sores.  Avoid contact with your eyes, ears, mouth and genitals (private parts).  Wash genitals (private parts) with your normal soap.  6.  Wash thoroughly, paying special attention to the area where your surgery        will be performed.  7.  Thoroughly rinse your body with warm water from the neck down.  8.  DO NOT shower/wash with your normal soap after using and rinsing off       the CHG Soap.  9.  Pat yourself dry with a clean towel.            10.  Wear clean pajamas.            11.  Place clean sheets on your bed the night of your first shower and do not        sleep with pets.  Day of Surgery  Do not apply any lotions/deodorants the morning of surgery.  Please wear clean clothes to the hospital.     Please read over the following fact sheets that you were given: Pain Booklet and Surgical Site Infection Prevention

## 2015-03-05 MED ORDER — CEFAZOLIN SODIUM-DEXTROSE 2-3 GM-% IV SOLR
2.0000 g | INTRAVENOUS | Status: AC
  Administered 2015-03-06: 2 g via INTRAVENOUS
  Filled 2015-03-05: qty 50

## 2015-03-05 NOTE — H&P (Signed)
PREOPERATIVE H&P  Chief Complaint: left clavicle fracture  HPI: Ronald Rios is a 49 y.o. male who presents for surgical treatment of left clavicle fracture.  He denies any changes in medical history.  Past Medical History  Diagnosis Date  . History of chicken pox     childhood  . Arthritis   . GERD (gastroesophageal reflux disease)     takes nexium  . Asthma     while doing cardio in cold weather, also with cigarette smoke  . Family history of adverse reaction to anesthesia     mom had  hallucinations after surgeries (in her 6280's)   Past Surgical History  Procedure Laterality Date  . No past surgeries  04/24/2012    per patient report  . Shoulder surgery Right 10/2014  . Wisdom tooth extraction     History   Social History  . Marital Status: Married    Spouse Name: N/A  . Number of Children: N/A  . Years of Education: N/A   Social History Main Topics  . Smoking status: Never Smoker   . Smokeless tobacco: Never Used  . Alcohol Use: Yes     Comment: rare  . Drug Use: No  . Sexual Activity: Not on file   Other Topics Concern  . Not on file   Social History Narrative   Caffeine daily   Family History  Problem Relation Age of Onset  . Heart disease Neg Hx   . Colon cancer Neg Hx   . Breast cancer Neg Hx   . Prostate cancer Father [id number]  . Diabetes Father   . Hypertension Mother   . Cancer Mother     pheochromocytoma  . Stroke Mother   . Multiple sclerosis Sister   . Cancer Sister     pheochromocytoma   Allergies  Allergen Reactions  . Codeine Other (See Comments)    Tylenol #3 caused depression and anxiety  . Tramadol Other (See Comments)    Caused depression and anxiety   Prior to Admission medications   Medication Sig Start Date End Date Taking? Authorizing Provider  acetaminophen-codeine (TYLENOL #3) 300-30 MG per tablet Take 1 tablet by mouth every 4 (four) hours as needed for moderate pain.    Historical Provider, MD  Esomeprazole Magnesium  (NEXIUM PO) Take 1 capsule by mouth daily.    Historical Provider, MD  Multiple Vitamin (MULTIVITAMIN WITH MINERALS) TABS tablet Take 1 tablet by mouth daily.    Historical Provider, MD  oxyCODONE-acetaminophen (PERCOCET/ROXICET) 5-325 MG per tablet Take 1-2 tablets by mouth every 4 (four) hours as needed for severe pain. 02/28/15   Mirian MoMatthew Gentry, MD     Positive ROS: All other systems have been reviewed and were otherwise negative with the exception of those mentioned in the HPI and as above.  Physical Exam: General: Alert, no acute distress Cardiovascular: No pedal edema Respiratory: No cyanosis, no use of accessory musculature GI: abdomen soft Skin: No lesions in the area of chief complaint Neurologic: Sensation intact distally Psychiatric: Patient is competent for consent with normal mood and affect Lymphatic: no lymphedema  MUSCULOSKELETAL: exam stable  Assessment: left clavicle fracture  Plan: Plan for Procedure(s): OPEN REDUCTION INTERNAL FIXATION (ORIF) LEFT CLAVICLE FRACTURE  The risks benefits and alternatives were discussed with the patient including but not limited to the risks of nonoperative treatment, versus surgical intervention including infection, bleeding, nerve injury,  blood clots, cardiopulmonary complications, morbidity, mortality, among others, and they were willing to proceed.   Roda ShuttersXu,  Etter Sjogren, MD   03/05/2015 10:11 PM

## 2015-03-06 ENCOUNTER — Ambulatory Visit (HOSPITAL_COMMUNITY)
Admission: RE | Admit: 2015-03-06 | Discharge: 2015-03-06 | Disposition: A | Discharge status: Home / Self Care | Payer: Managed Care, Other (non HMO) | Source: Ambulatory Visit | Attending: Orthopaedic Surgery | Admitting: Orthopaedic Surgery

## 2015-03-06 ENCOUNTER — Ambulatory Visit (HOSPITAL_COMMUNITY): Payer: Managed Care, Other (non HMO) | Admitting: Anesthesiology

## 2015-03-06 ENCOUNTER — Encounter (HOSPITAL_COMMUNITY): Payer: Self-pay | Admitting: *Deleted

## 2015-03-06 ENCOUNTER — Ambulatory Visit (HOSPITAL_COMMUNITY): Payer: Managed Care, Other (non HMO)

## 2015-03-06 ENCOUNTER — Encounter (HOSPITAL_COMMUNITY)
Admission: RE | Disposition: A | Discharge status: Home / Self Care | Payer: Self-pay | Source: Ambulatory Visit | Attending: Orthopaedic Surgery

## 2015-03-06 DIAGNOSIS — Z419 Encounter for procedure for purposes other than remedying health state, unspecified: Secondary | ICD-10-CM

## 2015-03-06 DIAGNOSIS — S42002A Fracture of unspecified part of left clavicle, initial encounter for closed fracture: Secondary | ICD-10-CM | POA: Insufficient documentation

## 2015-03-06 DIAGNOSIS — Y999 Unspecified external cause status: Secondary | ICD-10-CM | POA: Insufficient documentation

## 2015-03-06 DIAGNOSIS — F17210 Nicotine dependence, cigarettes, uncomplicated: Secondary | ICD-10-CM | POA: Insufficient documentation

## 2015-03-06 DIAGNOSIS — J45909 Unspecified asthma, uncomplicated: Secondary | ICD-10-CM | POA: Insufficient documentation

## 2015-03-06 DIAGNOSIS — M1990 Unspecified osteoarthritis, unspecified site: Secondary | ICD-10-CM | POA: Insufficient documentation

## 2015-03-06 DIAGNOSIS — Y929 Unspecified place or not applicable: Secondary | ICD-10-CM | POA: Insufficient documentation

## 2015-03-06 DIAGNOSIS — K219 Gastro-esophageal reflux disease without esophagitis: Secondary | ICD-10-CM | POA: Insufficient documentation

## 2015-03-06 DIAGNOSIS — Y939 Activity, unspecified: Secondary | ICD-10-CM | POA: Insufficient documentation

## 2015-03-06 DIAGNOSIS — Z79891 Long term (current) use of opiate analgesic: Secondary | ICD-10-CM | POA: Insufficient documentation

## 2015-03-06 DIAGNOSIS — X58XXXA Exposure to other specified factors, initial encounter: Secondary | ICD-10-CM | POA: Insufficient documentation

## 2015-03-06 DIAGNOSIS — Z09 Encounter for follow-up examination after completed treatment for conditions other than malignant neoplasm: Secondary | ICD-10-CM

## 2015-03-06 DIAGNOSIS — Z79899 Other long term (current) drug therapy: Secondary | ICD-10-CM | POA: Insufficient documentation

## 2015-03-06 HISTORY — PX: ORIF CLAVICULAR FRACTURE: SHX5055

## 2015-03-06 SURGERY — OPEN REDUCTION INTERNAL FIXATION (ORIF) CLAVICULAR FRACTURE
Anesthesia: Regional | Site: Shoulder | Laterality: Left

## 2015-03-06 MED ORDER — SUGAMMADEX SODIUM 200 MG/2ML IV SOLN
INTRAVENOUS | Status: AC
  Filled 2015-03-06: qty 2

## 2015-03-06 MED ORDER — SODIUM CHLORIDE 0.9 % IJ SOLN
INTRAMUSCULAR | Status: AC
  Filled 2015-03-06: qty 10

## 2015-03-06 MED ORDER — KETOROLAC TROMETHAMINE 30 MG/ML IJ SOLN
30.0000 mg | Freq: Once | INTRAMUSCULAR | Status: AC | PRN
  Administered 2015-03-06: 30 mg via INTRAVENOUS

## 2015-03-06 MED ORDER — NEOSTIGMINE METHYLSULFATE 10 MG/10ML IV SOLN
INTRAVENOUS | Status: DC | PRN
  Administered 2015-03-06: 3 mg via INTRAVENOUS

## 2015-03-06 MED ORDER — ROCURONIUM BROMIDE 100 MG/10ML IV SOLN
INTRAVENOUS | Status: DC | PRN
  Administered 2015-03-06: 50 mg via INTRAVENOUS

## 2015-03-06 MED ORDER — EPHEDRINE SULFATE 50 MG/ML IJ SOLN
INTRAMUSCULAR | Status: AC
  Filled 2015-03-06: qty 1

## 2015-03-06 MED ORDER — MIDAZOLAM HCL 5 MG/5ML IJ SOLN
INTRAMUSCULAR | Status: DC | PRN
  Administered 2015-03-06: 1 mg via INTRAVENOUS

## 2015-03-06 MED ORDER — PHENYLEPHRINE HCL 10 MG/ML IJ SOLN
10.0000 mg | INTRAVENOUS | Status: DC | PRN
  Administered 2015-03-06: 25 ug/min via INTRAVENOUS

## 2015-03-06 MED ORDER — GLYCOPYRROLATE 0.2 MG/ML IJ SOLN
INTRAMUSCULAR | Status: DC | PRN
  Administered 2015-03-06: 0.4 mg via INTRAVENOUS

## 2015-03-06 MED ORDER — METHOCARBAMOL 750 MG PO TABS: 750.0000 mg | ORAL_TABLET | Freq: Two times a day (BID) | ORAL | Status: DC | PRN

## 2015-03-06 MED ORDER — FENTANYL CITRATE 0.05 MG/ML IJ SOLN
INTRAMUSCULAR | Status: AC
  Administered 2015-03-06 (×2): 50 ug via INTRAVENOUS
  Administered 2015-03-06: 100 ug via INTRAVENOUS
  Administered 2015-03-06 (×3): 50 ug via INTRAVENOUS
  Filled 2015-03-06: qty 2

## 2015-03-06 MED ORDER — HYDROMORPHONE HCL 1 MG/ML IJ SOLN
INTRAMUSCULAR | Status: AC
  Filled 2015-03-06: qty 1

## 2015-03-06 MED ORDER — HYDROMORPHONE HCL 1 MG/ML IJ SOLN: 0.2500 mg | INTRAMUSCULAR | Status: DC | PRN

## 2015-03-06 MED ORDER — ROCURONIUM BROMIDE 50 MG/5ML IV SOLN
INTRAVENOUS | Status: AC
  Filled 2015-03-06: qty 1

## 2015-03-06 MED ORDER — PHENYLEPHRINE 40 MCG/ML (10ML) SYRINGE FOR IV PUSH (FOR BLOOD PRESSURE SUPPORT)
PREFILLED_SYRINGE | INTRAVENOUS | Status: AC
  Filled 2015-03-06: qty 10

## 2015-03-06 MED ORDER — MIDAZOLAM HCL 2 MG/2ML IJ SOLN
INTRAMUSCULAR | Status: AC
  Administered 2015-03-06: 2 mg via INTRAVENOUS
  Filled 2015-03-06: qty 2

## 2015-03-06 MED ORDER — LIDOCAINE HCL (CARDIAC) 20 MG/ML IV SOLN
INTRAVENOUS | Status: DC | PRN
  Administered 2015-03-06: 50 mg via INTRAVENOUS

## 2015-03-06 MED ORDER — ONDANSETRON HCL 4 MG/2ML IJ SOLN: 4.0000 mg | Freq: Once | INTRAMUSCULAR | Status: DC | PRN

## 2015-03-06 MED ORDER — LACTATED RINGERS IV SOLN
INTRAVENOUS | Status: DC | PRN
  Administered 2015-03-06 (×2): via INTRAVENOUS

## 2015-03-06 MED ORDER — OXYCODONE HCL 5 MG PO TABS: 5.0000 mg | ORAL_TABLET | ORAL | Status: DC | PRN

## 2015-03-06 MED ORDER — MIDAZOLAM HCL 2 MG/2ML IJ SOLN
INTRAMUSCULAR | Status: AC
  Filled 2015-03-06: qty 2

## 2015-03-06 MED ORDER — ONDANSETRON HCL 4 MG/2ML IJ SOLN
INTRAMUSCULAR | Status: AC
  Filled 2015-03-06: qty 2

## 2015-03-06 MED ORDER — GLYCOPYRROLATE 0.2 MG/ML IJ SOLN
INTRAMUSCULAR | Status: AC
  Filled 2015-03-06: qty 2

## 2015-03-06 MED ORDER — PROPOFOL 10 MG/ML IV BOLUS
INTRAVENOUS | Status: DC | PRN
  Administered 2015-03-06: 180 mg via INTRAVENOUS

## 2015-03-06 MED ORDER — NEOSTIGMINE METHYLSULFATE 10 MG/10ML IV SOLN
INTRAVENOUS | Status: AC
  Filled 2015-03-06: qty 1

## 2015-03-06 MED ORDER — KETOROLAC TROMETHAMINE 30 MG/ML IJ SOLN
INTRAMUSCULAR | Status: AC
  Filled 2015-03-06: qty 1

## 2015-03-06 MED ORDER — LIDOCAINE HCL (CARDIAC) 20 MG/ML IV SOLN
INTRAVENOUS | Status: AC
  Filled 2015-03-06: qty 5

## 2015-03-06 MED ORDER — FENTANYL CITRATE 0.05 MG/ML IJ SOLN
INTRAMUSCULAR | Status: AC
  Filled 2015-03-06: qty 5

## 2015-03-06 MED ORDER — PROPOFOL 10 MG/ML IV BOLUS
INTRAVENOUS | Status: AC
  Filled 2015-03-06: qty 20

## 2015-03-06 MED ORDER — LACTATED RINGERS IV SOLN
INTRAVENOUS | Status: DC
  Administered 2015-03-06: 07:00:00 via INTRAVENOUS

## 2015-03-06 MED ORDER — ONDANSETRON HCL 4 MG/2ML IJ SOLN
INTRAMUSCULAR | Status: DC | PRN
  Administered 2015-03-06: 4 mg via INTRAVENOUS

## 2015-03-06 SURGICAL SUPPLY — 49 items
BIT DRILL 2.0 (BIT) ×3 IMPLANT
CLOSURE WOUND 1/2 X4 (GAUZE/BANDAGES/DRESSINGS) ×1
COVER SURGICAL LIGHT HANDLE (MISCELLANEOUS) ×3 IMPLANT
DERMABOND ADVANCED (GAUZE/BANDAGES/DRESSINGS) ×2
DERMABOND ADVANCED .7 DNX12 (GAUZE/BANDAGES/DRESSINGS) ×1 IMPLANT
DRAPE C-ARM 42X72 X-RAY (DRAPES) ×3 IMPLANT
DRAPE IMP U-DRAPE 54X76 (DRAPES) ×3 IMPLANT
DRAPE INCISE IOBAN 66X45 STRL (DRAPES) IMPLANT
DRAPE PROXIMA HALF (DRAPES) ×3 IMPLANT
DRAPE SURG 17X23 STRL (DRAPES) ×3 IMPLANT
DRAPE U-SHAPE 47X51 STRL (DRAPES) ×3 IMPLANT
DRILL 2.6X220MM LONG AO (BIT) ×3 IMPLANT
DRSG TEGADERM 4X4.75 (GAUZE/BANDAGES/DRESSINGS) ×3 IMPLANT
DURAPREP 26ML APPLICATOR (WOUND CARE) ×3 IMPLANT
ELECT CAUTERY BLADE 6.4 (BLADE) ×3 IMPLANT
ELECT REM PT RETURN 9FT ADLT (ELECTROSURGICAL) ×3
ELECTRODE REM PT RTRN 9FT ADLT (ELECTROSURGICAL) ×1 IMPLANT
FACESHIELD WRAPAROUND (MASK) IMPLANT
GAUZE XEROFORM 1X8 LF (GAUZE/BANDAGES/DRESSINGS) ×3 IMPLANT
GLOVE NEODERM STRL 7.5 LF PF (GLOVE) ×3 IMPLANT
GLOVE SURG NEODERM 7.5  LF PF (GLOVE) ×6
GOWN STRL REIN XL XLG (GOWN DISPOSABLE) ×6 IMPLANT
KIT BASIN OR (CUSTOM PROCEDURE TRAY) ×3 IMPLANT
KIT ROOM TURNOVER OR (KITS) ×3 IMPLANT
MANIFOLD NEPTUNE II (INSTRUMENTS) ×3 IMPLANT
NEEDLE HYPO 25GX1X1/2 BEV (NEEDLE) IMPLANT
NS IRRIG 1000ML POUR BTL (IV SOLUTION) ×3 IMPLANT
PACK SHOULDER (CUSTOM PROCEDURE TRAY) ×3 IMPLANT
PACK UNIVERSAL I (CUSTOM PROCEDURE TRAY) ×3 IMPLANT
PAD ARMBOARD 7.5X6 YLW CONV (MISCELLANEOUS) ×6 IMPLANT
PLATE CLAVICLE VARIAX LT BRDG (Plate) ×3 IMPLANT
SCREW BONE 2.7X16MM (Screw) ×9 IMPLANT
SCREW BONE 3.5X10 (Screw) ×3 IMPLANT
SCREW BONE 3.5X16MM (Screw) ×6 IMPLANT
SCREW BONE 3.5X18MM (Screw) ×9 IMPLANT
SCREW BONE 3.5X20MM (Screw) ×3 IMPLANT
SLING ARM FOAM STRAP LRG (SOFTGOODS) ×3 IMPLANT
SPONGE LAP 18X18 X RAY DECT (DISPOSABLE) ×6 IMPLANT
STRIP CLOSURE SKIN 1/2X4 (GAUZE/BANDAGES/DRESSINGS) ×2 IMPLANT
SUCTION FRAZIER TIP 10 FR DISP (SUCTIONS) ×3 IMPLANT
SUT ETHILON 3 0 PS 1 (SUTURE) IMPLANT
SUT MNCRL AB 4-0 PS2 18 (SUTURE) ×3 IMPLANT
SUT PROLENE 3 0 PS 1 (SUTURE) IMPLANT
SUT VIC AB 2-0 CT1 27 (SUTURE) ×2
SUT VIC AB 2-0 CT1 TAPERPNT 27 (SUTURE) ×1 IMPLANT
SUT VICRYL 0 CT 1 36IN (SUTURE) ×3 IMPLANT
SYR CONTROL 10ML LL (SYRINGE) IMPLANT
WATER STERILE IRR 1000ML POUR (IV SOLUTION) ×3 IMPLANT
YANKAUER SUCT BULB TIP NO VENT (SUCTIONS) ×3 IMPLANT

## 2015-03-06 NOTE — Anesthesia Procedure Notes (Addendum)
Procedure Name: Intubation Date/Time: 03/06/2015 8:28 AM Performed by: Ferol LuzMCMILLEN, MICHAEL L Pre-anesthesia Checklist: Patient identified, Emergency Drugs available, Suction available, Timeout performed and Patient being monitored Patient Re-evaluated:Patient Re-evaluated prior to inductionOxygen Delivery Method: Circle system utilized Preoxygenation: Pre-oxygenation with 100% oxygen Intubation Type: IV induction Ventilation: Mask ventilation without difficulty Laryngoscope Size: Mac and 4 Grade View: Grade I Tube type: Oral Tube size: 7.5 mm Number of attempts: 1 Airway Equipment and Method: Stylet Placement Confirmation: ETT inserted through vocal cords under direct vision,  positive ETCO2 and breath sounds checked- equal and bilateral Secured at: 21 cm Tube secured with: Tape Dental Injury: Teeth and Oropharynx as per pre-operative assessment    Anesthesia Regional Block:  Interscalene brachial plexus block  Pre-Anesthetic Checklist: ,, timeout performed, Correct Patient, Correct Site, Correct Laterality, Correct Procedure, Correct Position, site marked, Risks and benefits discussed,  Surgical consent,  Pre-op evaluation,  At surgeon's request and post-op pain management  Laterality: Left  Prep: chloraprep       Needles:  Injection technique: Single-shot  Needle Type: Echogenic Stimulator Needle     Needle Length: 9cm 9 cm Needle Gauge: 22 and 22 G    Additional Needles:  Procedures: nerve stimulator Interscalene brachial plexus block Narrative:  Start time: 03/06/2015 7:10 AM End time: 03/06/2015 7:15 AM Injection made incrementally with aspirations every 5 mL.  Performed by: Personally   Additional Notes: 30 cc 0.5% Marcaine with 1:200 Epi

## 2015-03-06 NOTE — Anesthesia Postprocedure Evaluation (Signed)
  Anesthesia Post-op Note  Patient: Ronald Rios  Procedure(s) Performed: Procedure(s): OPEN REDUCTION INTERNAL FIXATION (ORIF) LEFT CLAVICLE FRACTURE (Left)  Patient Location: PACU  Anesthesia Type:General and GA combined with regional for post-op pain  Level of Consciousness: awake, alert  and oriented  Airway and Oxygen Therapy: Patient Spontanous Breathing  Post-op Pain: mild  Post-op Assessment: Post-op Vital signs reviewed, Patient's Cardiovascular Status Stable, Respiratory Function Stable, Patent Airway and Pain level controlled  Post-op Vital Signs: stable  Last Vitals:  Filed Vitals:   03/06/15 1145  BP: 117/74  Pulse: 75  Temp:   Resp: 16    Complications: No apparent anesthesia complications

## 2015-03-06 NOTE — Anesthesia Preprocedure Evaluation (Signed)
Anesthesia Evaluation  Patient identified by MRN, date of birth, ID band Patient awake    Airway        Dental   Pulmonary asthma ,          Cardiovascular     Neuro/Psych    GI/Hepatic GERD-  Controlled,  Endo/Other    Renal/GU      Musculoskeletal  (+) Osteoarthritis,    Abdominal   Peds  Hematology   Anesthesia Other Findings   Reproductive/Obstetrics                             Anesthesia Physical Anesthesia Plan  ASA: II  Anesthesia Plan: General   Post-op Pain Management: MAC Combined w/ Regional for Post-op pain   Induction: Intravenous  Airway Management Planned: Oral ETT  Additional Equipment:   Intra-op Plan:   Post-operative Plan: Extubation in OR  Informed Consent: I have reviewed the patients History and Physical, chart, labs and discussed the procedure including the risks, benefits and alternatives for the proposed anesthesia with the patient or authorized representative who has indicated his/her understanding and acceptance.   Dental advisory given  Plan Discussed with: CRNA and Anesthesiologist  Anesthesia Plan Comments:         Anesthesia Quick Evaluation

## 2015-03-06 NOTE — Op Note (Signed)
   Date of Surgery: 03/06/2015  INDICATIONS: Mr. Ronald Rios is a 49 y.o.-year-old male who sustained a left clavicle fracture;  The patient did consent to the procedure after discussion of the risks and benefits.  PREOPERATIVE DIAGNOSIS: Left clavicle fracture  POSTOPERATIVE DIAGNOSIS: Same.  PROCEDURE: Open treatment of left clavicle fracture with internal fixation  SURGEON: N. Glee ArvinMichael Anupama Piehl, M.D.  ASSIST: Patrick Jupiterarla Bethune, RNFA.  ANESTHESIA:  general, regional  IV FLUIDS AND URINE: See anesthesia.  ESTIMATED BLOOD LOSS: minimal mL.  IMPLANTS: [employer] Variax superior plate 8 hole   COMPLICATIONS: None.  DESCRIPTION OF PROCEDURE: The patient was brought to the operating room and placed supine on the operating table.  The patient had been signed prior to the procedure and this was documented. The patient had the anesthesia placed by the anesthesiologist.  The patient was then placed in the beach chair position.  All bony prominences were well padded.  A time-out was performed to confirm that this was the correct patient, site, side and location. The patient had an SCD on the lower extremities. The patient did receive antibiotics prior to the incision and was re-dosed during the procedure as needed at indicated intervals.  The patient had the operative extremity prepped and draped in the standard surgical fashion.    A horizontal incision based over the clavicle was used.  Cutaneous nerves were identified and protected as much as possible.  Full thickness flaps were elevated off of the clavicle.  The fracture was exposed.  Any organized hematoma and entrapped periosteum was removed from the fracture site. We first reduced the butterfly fragment to the distal segment of the clavicle. Two 2.7 mm interfragmentary screws were placed from anterior to posterior direction to hold the fracture. We then reduced the distal segment to the proximal segment. Another 2.7 mm interfragmentary screw was used to hold this  reduced.  With the entire clavicle reduced we then placed a superior 8 hole plate.  The appropriate length was found by using fluoroscopy.  With the plate in the appropriate position and the fracture reduced, nonlocking screws were placed through the plate and into the clavicle.  Care was taken not to plunge with any of the instruments.  Retractors were used as added protection to the neurovascular and pulmonary structures.   Altogether 6 nonlocking 3.5 mm screws were placed through the plate and into the clavicle with 3 proximal and 3 distal to the fractureFinal fluoroscopy pictures were taken to confirm plate placement and fracture reduction.  The wound was thoroughly irrigated and closed in a layer fashion using 0 vicryl, 2.0 vicryl and 4.0 monocryl.  Dermabond and steri strips were placed.  Sterile dressings were applied and the patient was extubated and transferred to the PACU in stable condition.  POSTOPERATIVE PLAN: Patient will be in a sling for comfort.  He is allowed to range his shoulder up to the level of the shoulder and not allowed to lift anything.  Observation overnight for pain control and discharge home in the morning.  Mayra ReelN. Michael Lesta Limbert, MD [facility] [phone] 9:44 AM

## 2015-03-06 NOTE — Discharge Instructions (Signed)
1. Change surgical dressing as needed 2. May shower with the incision exposed in 5 days after surgery 3. Use sling for comfort 4. May raise arm to the level of the shoulder but not past it.

## 2015-03-06 NOTE — Transfer of Care (Signed)
Immediate Anesthesia Transfer of Care Note  Patient: Ronald LongsJoseph Hocker  Procedure(s) Performed: Procedure(s): OPEN REDUCTION INTERNAL FIXATION (ORIF) LEFT CLAVICLE FRACTURE (Left)  Patient Location: PACU  Anesthesia Type:General and Regional  Level of Consciousness: awake, alert , oriented and patient cooperative  Airway & Oxygen Therapy: Patient Spontanous Breathing  Post-op Assessment: Report given to RN, Post -op Vital signs reviewed and stable and Patient moving all extremities  Post vital signs: Reviewed and stable  Last Vitals:  Filed Vitals:   03/06/15 1019  BP:   Pulse: 75  Temp: 36.6 C  Resp: 10    Complications: No apparent anesthesia complications

## 2015-03-10 ENCOUNTER — Encounter (HOSPITAL_COMMUNITY): Payer: Self-pay | Admitting: Orthopaedic Surgery

## 2015-03-23 ENCOUNTER — Encounter (HOSPITAL_COMMUNITY): Payer: Self-pay | Admitting: Orthopaedic Surgery

## 2015-12-13 ENCOUNTER — Encounter: Payer: Self-pay | Admitting: Physician Assistant

## 2015-12-28 ENCOUNTER — Ambulatory Visit (INDEPENDENT_AMBULATORY_CARE_PROVIDER_SITE_OTHER): Payer: Managed Care, Other (non HMO) | Admitting: Physician Assistant

## 2015-12-28 ENCOUNTER — Encounter: Payer: Self-pay | Admitting: Physician Assistant

## 2015-12-28 VITALS — BP 133/80 | HR 64 | Temp 97.8°F | Resp 18 | Ht 77.5 in | Wt 222.0 lb

## 2015-12-28 DIAGNOSIS — Z125 Encounter for screening for malignant neoplasm of prostate: Secondary | ICD-10-CM

## 2015-12-28 DIAGNOSIS — Z23 Encounter for immunization: Secondary | ICD-10-CM | POA: Diagnosis not present

## 2015-12-28 DIAGNOSIS — Z1211 Encounter for screening for malignant neoplasm of colon: Secondary | ICD-10-CM | POA: Diagnosis not present

## 2015-12-28 DIAGNOSIS — R7989 Other specified abnormal findings of blood chemistry: Secondary | ICD-10-CM

## 2015-12-28 DIAGNOSIS — Z0000 Encounter for general adult medical examination without abnormal findings: Secondary | ICD-10-CM

## 2015-12-28 DIAGNOSIS — Z0001 Encounter for general adult medical examination with abnormal findings: Secondary | ICD-10-CM | POA: Diagnosis not present

## 2015-12-28 DIAGNOSIS — F411 Generalized anxiety disorder: Secondary | ICD-10-CM | POA: Insufficient documentation

## 2015-12-28 LAB — COMPREHENSIVE METABOLIC PANEL
ALBUMIN: 4.6 g/dL (ref 3.5–5.2)
ALK PHOS: 56 U/L (ref 39–117)
ALT: 21 U/L (ref 0–53)
AST: 16 U/L (ref 0–37)
BUN: 16 mg/dL (ref 6–23)
CO2: 29 mEq/L (ref 19–32)
Calcium: 9.6 mg/dL (ref 8.4–10.5)
Chloride: 103 mEq/L (ref 96–112)
Creatinine, Ser: 1 mg/dL (ref 0.40–1.50)
GFR: 83.52 mL/min (ref >60.00)
Glucose, Bld: 102 mg/dL — ABNORMAL HIGH (ref 70–99)
POTASSIUM: 3.8 meq/L (ref 3.5–5.1)
Sodium: 141 mEq/L (ref 135–145)
Total Bilirubin: 0.7 mg/dL (ref 0.2–1.2)
Total Protein: 7.5 g/dL (ref 6.0–8.3)

## 2015-12-28 LAB — URINALYSIS, ROUTINE W REFLEX MICROSCOPIC
Bilirubin Urine: NEGATIVE
Hgb urine dipstick: NEGATIVE
Ketones, ur: NEGATIVE
LEUKOCYTES UA: NEGATIVE
Nitrite: NEGATIVE
Total Protein, Urine: NEGATIVE
URINE GLUCOSE: NEGATIVE
UROBILINOGEN UA: 0.2 (ref 0.0–1.0)
pH: 5.5 (ref 5.0–8.0)

## 2015-12-28 LAB — LIPID PANEL
CHOL/HDL RATIO: 5
CHOLESTEROL: 188 mg/dL (ref 0–200)
HDL: 35.5 mg/dL — ABNORMAL LOW (ref >39.00)
NONHDL: 152.52
Triglycerides: 205 mg/dL — ABNORMAL HIGH (ref 0.0–149.0)
VLDL: 41 mg/dL — AB (ref 0.0–40.0)

## 2015-12-28 LAB — CBC
HCT: 46.9 % (ref 39.0–52.0)
HEMOGLOBIN: 15.1 g/dL (ref 13.0–17.0)
MCHC: 32.3 g/dL (ref 30.0–36.0)
MCV: 84.4 fl (ref 78.0–100.0)
Platelets: 274 10*3/uL (ref 150.0–400.0)
RBC: 5.55 Mil/uL (ref 4.22–5.81)
RDW: 13.6 % (ref 11.5–15.5)
WBC: 8.1 10*3/uL (ref 4.0–10.5)

## 2015-12-28 LAB — HEMOGLOBIN A1C: Hgb A1c MFr Bld: 6.1 % (ref 4.6–6.5)

## 2015-12-28 LAB — LDL CHOLESTEROL, DIRECT: LDL DIRECT: 106 mg/dL

## 2015-12-28 LAB — PSA: PSA: 0.87 ng/mL (ref 0.10–4.00)

## 2015-12-28 LAB — TSH: TSH: 1.26 u[IU]/mL (ref 0.35–4.50)

## 2015-12-28 MED ORDER — LORAZEPAM 0.5 MG PO TABS: 0.5000 mg | ORAL_TABLET | Freq: Two times a day (BID) | ORAL | Status: DC | PRN

## 2015-12-28 NOTE — Assessment & Plan Note (Signed)
Handout on our counseling services given to patient. He agrees to set up appointment. Rx Ativan to use sparingly for intense anxiety. Increase exercise. Follow-up 1-2 months.

## 2015-12-28 NOTE — Patient Instructions (Signed)
Please go to the lab for blood work.  I will call you with your results. If your blood work is normal we will follow-up yearly for physicals.  If anything is abnormal we will treat and get you in for a follow-up.  You will be contacted for a screening colonoscopy.  Take the Ativan as directed for severe acute anxiety. Please schedule an appointment with our counselors. Follow-up with me in 2 months.  Preventive Care for Adults, Male A healthy lifestyle and preventive care can promote health and wellness. Preventive health guidelines for men include the following key practices:  A routine yearly physical is a good way to check with your health care provider about your health and preventative screening. It is a chance to share any concerns and updates on your health and to receive a thorough exam.  Visit your dentist for a routine exam and preventative care every 6 months. Brush your teeth twice a day and floss once a day. Good oral hygiene prevents tooth decay and gum disease.  The frequency of eye exams is based on your age, health, family medical history, use of contact lenses, and other factors. Follow your health care provider's recommendations for frequency of eye exams.  Eat a healthy diet. Foods such as vegetables, fruits, whole grains, low-fat dairy products, and lean protein foods contain the nutrients you need without too many calories. Decrease your intake of foods high in solid fats, added sugars, and salt. Eat the right amount of calories for you.Get information about a proper diet from your health care provider, if necessary.  Regular physical exercise is one of the most important things you can do for your health. Most adults should get at least 150 minutes of moderate-intensity exercise (any activity that increases your heart rate and causes you to sweat) each week. In addition, most adults need muscle-strengthening exercises on 2 or more days a week.  Maintain a healthy  weight. The body mass index (BMI) is a screening tool to identify possible weight problems. It provides an estimate of body fat based on height and weight. Your health care provider can find your BMI and can help you achieve or maintain a healthy weight.For adults 20 years and older:  A BMI below 18.5 is considered underweight.  A BMI of 18.5 to 24.9 is normal.  A BMI of 25 to 29.9 is considered overweight.  A BMI of 30 and above is considered obese.  Maintain normal blood lipids and cholesterol levels by exercising and minimizing your intake of saturated fat. Eat a balanced diet with plenty of fruit and vegetables. Blood tests for lipids and cholesterol should begin at age 2 and be repeated every 5 years. If your lipid or cholesterol levels are high, you are over 50, or you are at high risk for heart disease, you may need your cholesterol levels checked more frequently.Ongoing high lipid and cholesterol levels should be treated with medicines if diet and exercise are not working.  If you smoke, find out from your health care provider how to quit. If you do not use tobacco, do not start.  Lung cancer screening is recommended for adults aged 7-80 years who are at high risk for developing lung cancer because of a history of smoking. A yearly low-dose CT scan of the lungs is recommended for people who have at least a 30-pack-year history of smoking and are a current smoker or have quit within the past 15 years. A pack year of smoking is smoking  an average of 1 pack of cigarettes a day for 1 year (for example: 1 pack a day for 30 years or 2 packs a day for 15 years). Yearly screening should continue until the smoker has stopped smoking for at least 15 years. Yearly screening should be stopped for people who develop a health problem that would prevent them from having lung cancer treatment.  If you choose to drink alcohol, do not have more than 2 drinks per day. One drink is considered to be 12 ounces  (355 mL) of beer, 5 ounces (148 mL) of wine, or 1.5 ounces (44 mL) of liquor.  Avoid use of street drugs. Do not share needles with anyone. Ask for help if you need support or instructions about stopping the use of drugs.  High blood pressure causes heart disease and increases the risk of stroke. Your blood pressure should be checked at least every 1-2 years. Ongoing high blood pressure should be treated with medicines, if weight loss and exercise are not effective.  If you are 66-2 years old, ask your health care provider if you should take aspirin to prevent heart disease.  Diabetes screening is done by taking a blood sample to check your blood glucose level after you have not eaten for a certain period of time (fasting). If you are not overweight and you do not have risk factors for diabetes, you should be screened once every 3 years starting at age 59. If you are overweight or obese and you are 56-71 years of age, you should be screened for diabetes every year as part of your cardiovascular risk assessment.  Colorectal cancer can be detected and often prevented. Most routine colorectal cancer screening begins at the age of 37 and continues through age 42. However, your health care provider may recommend screening at an earlier age if you have risk factors for colon cancer. On a yearly basis, your health care provider may provide home test kits to check for hidden blood in the stool. Use of a small camera at the end of a tube to directly examine the colon (sigmoidoscopy or colonoscopy) can detect the earliest forms of colorectal cancer. Talk to your health care provider about this at age 19, when routine screening begins. Direct exam of the colon should be repeated every 5-10 years through age 27, unless early forms of precancerous polyps or small growths are found.  People who are at an increased risk for hepatitis B should be screened for this virus. You are considered at high risk for hepatitis B  if:  You were born in a country where hepatitis B occurs often. Talk with your health care provider about which countries are considered high risk.  Your parents were born in a high-risk country and you have not received a shot to protect against hepatitis B (hepatitis B vaccine).  You have HIV or AIDS.  You use needles to inject street drugs.  You live with, or have sex with, someone who has hepatitis B.  You are a man who has sex with other men (MSM).  You get hemodialysis treatment.  You take certain medicines for conditions such as cancer, organ transplantation, and autoimmune conditions.  Hepatitis C blood testing is recommended for all people born from [birth year] through 1965 and any individual with known risks for hepatitis C.  Practice safe sex. Use condoms and avoid high-risk sexual practices to reduce the spread of sexually transmitted infections (STIs). STIs include gonorrhea, chlamydia, syphilis, trichomonas, herpes, HPV, and  human immunodeficiency virus (HIV). Herpes, HIV, and HPV are viral illnesses that have no cure. They can result in disability, cancer, and death.  If you are a man who has sex with other men, you should be screened at least once per year for:  HIV.  Urethral, rectal, and pharyngeal infection of gonorrhea, chlamydia, or both.  If you are at risk of being infected with HIV, it is recommended that you take a prescription medicine daily to prevent HIV infection. This is called preexposure prophylaxis (PrEP). You are considered at risk if:  You are a man who has sex with other men (MSM) and have other risk factors.  You are a heterosexual man, are sexually active, and are at increased risk for HIV infection.  You take drugs by injection.  You are sexually active with a partner who has HIV.  Talk with your health care provider about whether you are at high risk of being infected with HIV. If you choose to begin PrEP, you should first be tested for HIV. You  should then be tested every 3 months for as long as you are taking PrEP.  A one-time screening for abdominal aortic aneurysm (AAA) and surgical repair of large AAAs by ultrasound are recommended for men ages 43 to 80 years who are current or former smokers.  Healthy men should no longer receive prostate-specific antigen (PSA) blood tests as part of routine cancer screening. Talk with your health care provider about prostate cancer screening.  Testicular cancer screening is not recommended for adult males who have no symptoms. Screening includes self-exam, a health care provider exam, and other screening tests. Consult with your health care provider about any symptoms you have or any concerns you have about testicular cancer.  Use sunscreen. Apply sunscreen liberally and repeatedly throughout the day. You should seek shade when your shadow is shorter than you. Protect yourself by wearing long sleeves, pants, a wide-brimmed hat, and sunglasses year round, whenever you are outdoors.  Once a month, do a whole-body skin exam, using a mirror to look at the skin on your back. Tell your health care provider about new moles, moles that have irregular borders, moles that are larger than a pencil eraser, or moles that have changed in shape or color.  Stay current with required vaccines (immunizations).  Influenza vaccine. All adults should be immunized every year.  Tetanus, diphtheria, and acellular pertussis (Td, Tdap) vaccine. An adult who has not previously received Tdap or who does not know his vaccine status should receive 1 dose of Tdap. This initial dose should be followed by tetanus and diphtheria toxoids (Td) booster doses every 10 years. Adults with an unknown or incomplete history of completing a 3-dose immunization series with Td-containing vaccines should begin or complete a primary immunization series including a Tdap dose. Adults should receive a Td booster every 10 years.  Varicella vaccine.  An adult without evidence of immunity to varicella should receive 2 doses or a second dose if he has previously received 1 dose.  Human papillomavirus (HPV) vaccine. Males aged 11-21 years who have not received the vaccine previously should receive the 3-dose series. Males aged 22-26 years may be immunized. Immunization is recommended through the age of 21 years for any male who has sex with males and did not get any or all doses earlier. Immunization is recommended for any person with an immunocompromised condition through the age of 61 years if he did not get any or all doses earlier. During  the 3-dose series, the second dose should be obtained 4-8 weeks after the first dose. The third dose should be obtained 24 weeks after the first dose and 16 weeks after the second dose.  Zoster vaccine. One dose is recommended for adults aged 35 years or older unless certain conditions are present.  Measles, mumps, and rubella (MMR) vaccine. Adults born before [birth year] generally are considered immune to measles and mumps. Adults born in [birth year] or later should have 1 or more doses of MMR vaccine unless there is a contraindication to the vaccine or there is laboratory evidence of immunity to each of the three diseases. A routine second dose of MMR vaccine should be obtained at least 28 days after the first dose for students attending postsecondary schools, health care workers, or international travelers. People who received inactivated measles vaccine or an unknown type of measles vaccine during 1963-1967 should receive 2 doses of MMR vaccine. People who received inactivated mumps vaccine or an unknown type of mumps vaccine before 1979 and are at high risk for mumps infection should consider immunization with 2 doses of MMR vaccine. Unvaccinated health care workers born before [birth year] who lack laboratory evidence of measles, mumps, or rubella immunity or laboratory confirmation of disease should consider measles and mumps  immunization with 2 doses of MMR vaccine or rubella immunization with 1 dose of MMR vaccine.  Pneumococcal 13-valent conjugate (PCV13) vaccine. When indicated, a person who is uncertain of his immunization history and has no record of immunization should receive the PCV13 vaccine. All adults 73 years of age and older should receive this vaccine. An adult aged 57 years or older who has certain medical conditions and has not been previously immunized should receive 1 dose of PCV13 vaccine. This PCV13 should be followed with a dose of pneumococcal polysaccharide (PPSV23) vaccine. Adults who are at high risk for pneumococcal disease should obtain the PPSV23 vaccine at least 8 weeks after the dose of PCV13 vaccine. Adults older than 52 years of age who have normal immune system function should obtain the PPSV23 vaccine dose at least 1 year after the dose of PCV13 vaccine.  Pneumococcal polysaccharide (PPSV23) vaccine. When PCV13 is also indicated, PCV13 should be obtained first. All adults aged 81 years and older should be immunized. An adult younger than age 52 years who has certain medical conditions should be immunized. Any person who resides in a nursing home or long-term care facility should be immunized. An adult smoker should be immunized. People with an immunocompromised condition and certain other conditions should receive both PCV13 and PPSV23 vaccines. People with human immunodeficiency virus (HIV) infection should be immunized as soon as possible after diagnosis. Immunization during chemotherapy or radiation therapy should be avoided. Routine use of PPSV23 vaccine is not recommended for American Indians, [place] Natives, or people younger than 65 years unless there are medical conditions that require PPSV23 vaccine. When indicated, people who have unknown immunization and have no record of immunization should receive PPSV23 vaccine. One-time revaccination 5 years after the first dose of PPSV23 is  recommended for people aged 19-64 years who have chronic kidney failure, nephrotic syndrome, asplenia, or immunocompromised conditions. People who received 1-2 doses of PPSV23 before age 67 years should receive another dose of PPSV23 vaccine at age 60 years or later if at least 5 years have passed since the previous dose. Doses of PPSV23 are not needed for people immunized with PPSV23 at or after age 13 years.  Meningococcal vaccine. Adults with asplenia  or persistent complement component deficiencies should receive 2 doses of quadrivalent meningococcal conjugate (MenACWY-D) vaccine. The doses should be obtained at least 2 months apart. Microbiologists working with certain meningococcal bacteria, [employer] recruits, people at risk during an outbreak, and people who travel to or live in countries with a high rate of meningitis should be immunized. A first-year college student up through age 73 years who is living in a residence hall should receive a dose if he did not receive a dose on or after his 16th birthday. Adults who have certain high-risk conditions should receive one or more doses of vaccine.  Hepatitis A vaccine. Adults who wish to be protected from this disease, have chronic liver disease, work with hepatitis A-infected animals, work in hepatitis A research labs, or travel to or work in countries with a high rate of hepatitis A should be immunized. Adults who were previously unvaccinated and who anticipate close contact with an international adoptee during the first 60 days after arrival in the [place] from a country with a high rate of hepatitis A should be immunized.  Hepatitis B vaccine. Adults should be immunized if they wish to be protected from this disease, are under age 65 years and have diabetes, have chronic liver disease, have had more than one sex partner in the past 6 months, may be exposed to blood or other infectious body fluids, are household contacts or sex partners of hepatitis  B positive people, are clients or workers in certain care facilities, or travel to or work in countries with a high rate of hepatitis B.  Haemophilus influenzae type b (Hib) vaccine. A previously unvaccinated person with asplenia or sickle cell disease or having a scheduled splenectomy should receive 1 dose of Hib vaccine. Regardless of previous immunization, a recipient of a hematopoietic stem cell transplant should receive a 3-dose series 6-12 months after his successful transplant. Hib vaccine is not recommended for adults with HIV infection. Preventive Service / Frequency Ages 59 to 26  Blood pressure check.** / Every 3-5 years.  Lipid and cholesterol check.** / Every 5 years beginning at age 75.  Hepatitis C blood test.** / For any individual with known risks for hepatitis C.  Skin self-exam. / Monthly.  Influenza vaccine. / Every year.  Tetanus, diphtheria, and acellular pertussis (Tdap, Td) vaccine.** / Consult your health care provider. 1 dose of Td every 10 years.  Varicella vaccine.** / Consult your health care provider.  HPV vaccine. / 3 doses over 6 months, if 79 or younger.  Measles, mumps, rubella (MMR) vaccine.** / You need at least 1 dose of MMR if you were born in 1957 or later. You may also need a second dose.  Pneumococcal 13-valent conjugate (PCV13) vaccine.** / Consult your health care provider.  Pneumococcal polysaccharide (PPSV23) vaccine.** / 1 to 2 doses if you smoke cigarettes or if you have certain conditions.  Meningococcal vaccine.** / 1 dose if you are age 65 to 75 years and a Market researcher living in a residence hall, or have one of several medical conditions. You may also need additional booster doses.  Hepatitis A vaccine.** / Consult your health care provider.  Hepatitis B vaccine.** / Consult your health care provider.  Haemophilus influenzae type b (Hib) vaccine.** / Consult your health care provider. Ages 51 to 16  Blood pressure  check.** / Every year.  Lipid and cholesterol check.** / Every 5 years beginning at age 71.  Lung cancer screening. / Every year if you  are aged 23-80 years and have a 30-pack-year history of smoking and currently smoke or have quit within the past 15 years. Yearly screening is stopped once you have quit smoking for at least 15 years or develop a health problem that would prevent you from having lung cancer treatment.  Fecal occult blood test (FOBT) of stool. / Every year beginning at age 17 and continuing until age 25. You may not have to do this test if you get a colonoscopy every 10 years.  Flexible sigmoidoscopy** or colonoscopy.** / Every 5 years for a flexible sigmoidoscopy or every 10 years for a colonoscopy beginning at age 22 and continuing until age 8.  Hepatitis C blood test.** / For all people born from [birth year] through 1965 and any individual with known risks for hepatitis C.  Skin self-exam. / Monthly.  Influenza vaccine. / Every year.  Tetanus, diphtheria, and acellular pertussis (Tdap/Td) vaccine.** / Consult your health care provider. 1 dose of Td every 10 years.  Varicella vaccine.** / Consult your health care provider.  Zoster vaccine.** / 1 dose for adults aged 50 years or older.  Measles, mumps, rubella (MMR) vaccine.** / You need at least 1 dose of MMR if you were born in 1959 or later. You may also need a second dose.  Pneumococcal 13-valent conjugate (PCV13) vaccine.** / Consult your health care provider.  Pneumococcal polysaccharide (PPSV23) vaccine.** / 1 to 2 doses if you smoke cigarettes or if you have certain conditions.  Meningococcal vaccine.** / Consult your health care provider.  Hepatitis A vaccine.** / Consult your health care provider.  Hepatitis B vaccine.** / Consult your health care provider.  Haemophilus influenzae type b (Hib) vaccine.** / Consult your health care provider. Ages 98 and over  Blood pressure check.** / Every year.  Lipid and  cholesterol check.**/ Every 5 years beginning at age 32.  Lung cancer screening. / Every year if you are aged 60-80 years and have a 30-pack-year history of smoking and currently smoke or have quit within the past 15 years. Yearly screening is stopped once you have quit smoking for at least 15 years or develop a health problem that would prevent you from having lung cancer treatment.  Fecal occult blood test (FOBT) of stool. / Every year beginning at age 86 and continuing until age 60. You may not have to do this test if you get a colonoscopy every 10 years.  Flexible sigmoidoscopy** or colonoscopy.** / Every 5 years for a flexible sigmoidoscopy or every 10 years for a colonoscopy beginning at age 26 and continuing until age 43.  Hepatitis C blood test.** / For all people born from [birth year] through 1967 and any individual with known risks for hepatitis C.  Abdominal aortic aneurysm (AAA) screening.** / A one-time screening for ages 31 to 33 years who are current or former smokers.  Skin self-exam. / Monthly.  Influenza vaccine. / Every year.  Tetanus, diphtheria, and acellular pertussis (Tdap/Td) vaccine.** / 1 dose of Td every 10 years.  Varicella vaccine.** / Consult your health care provider.  Zoster vaccine.** / 1 dose for adults aged 68 years or older.  Pneumococcal 13-valent conjugate (PCV13) vaccine.** / 1 dose for all adults aged 39 years and older.  Pneumococcal polysaccharide (PPSV23) vaccine.** / 1 dose for all adults aged 85 years and older.  Meningococcal vaccine.** / Consult your health care provider.  Hepatitis A vaccine.** / Consult your health care provider.  Hepatitis B vaccine.** / Consult your health care provider.  Haemophilus influenzae type b (Hib) vaccine.** / Consult your health care provider. **Family history and personal history of risk and conditions may change your health care provider's recommendations.   This information is not intended to replace advice  given to you by your health care provider. Make sure you discuss any questions you have with your health care provider.   Document Released: 01/17/2002 Document Revised: 12/12/2014 Document Reviewed: 04/18/2011 Elsevier Interactive Patient Education [employer].

## 2015-12-28 NOTE — Assessment & Plan Note (Signed)
Average risk. Asymptomatic. Defers DRE. Will proceed with PSA today.

## 2015-12-28 NOTE — Progress Notes (Signed)
Patient presents to clinic today for annual exam.  Patient is fasting for labs.  Acute Concerns: Patient c/o spells of anxiety occurring over the last year, typically lasting a few days per episode. Endorses feeling stressed about non-stressful situations. Does endorses acute anxiety with increased heart rate. Denies depressed mood or anhedonia. Endorses exercise helps with symptoms. Would like to discuss treatment for these episodes of acute anxiety.  Chronic Issues: GERD -- Is currently on Nexium OTC rarely for symptoms. Endorses symptoms well controlled when this medication is used.  Health Maintenance: Immunizations -- Flu shot updated today. Tetanus up-to-date. Colonoscopy -- Average risk. Due for colonoscopy. Will refer to GI for screening colonoscopy.  Past Medical History  Diagnosis Date  . History of chicken pox     childhood  . Arthritis   . GERD (gastroesophageal reflux disease)     takes nexium  . Asthma     while doing cardio in cold weather, also with cigarette smoke  . Family history of adverse reaction to anesthesia     mom had  hallucinations after surgeries (in her 51's)    Past Surgical History  Procedure Laterality Date  . Shoulder surgery Right 10/2014  . Wisdom tooth extraction      had 3 removed.  . Orif clavicular fracture Left 03/06/2015    Procedure: OPEN REDUCTION INTERNAL FIXATION (ORIF) LEFT CLAVICLE FRACTURE;  Surgeon: Tarry Kos, MD;  Location: [facility] OR;  Service: Orthopedics;  Laterality: Left;    Current Outpatient Prescriptions on File Prior to Visit  Medication Sig Dispense Refill  . Esomeprazole Magnesium (NEXIUM PO) Take 1 capsule by mouth daily.    . Multiple Vitamin (MULTIVITAMIN WITH MINERALS) TABS tablet Take 1 tablet by mouth daily.     No current facility-administered medications on file prior to visit.    Allergies  Allergen Reactions  . Codeine Other (See Comments)    Tylenol #3 caused depression and anxiety  . Tramadol  Other (See Comments)    Caused depression and anxiety    Family History  Problem Relation Age of Onset  . Heart disease Neg Hx   . Colon cancer Neg Hx   . Breast cancer Neg Hx   . Prostate cancer Father 23  . Diabetes Father   . Hypertension Mother   . Cancer Mother     pheochromocytoma  . Stroke Mother   . Multiple sclerosis Sister   . Cancer Sister     pheochromocytoma    Social History   Social History  . Marital Status: Married    Spouse Name: N/A  . Number of Children: N/A  . Years of Education: N/A   Occupational History  . Not on file.   Social History Main Topics  . Smoking status: Never Smoker   . Smokeless tobacco: Never Used  . Alcohol Use: 0.0 oz/week    0 Standard drinks or equivalent per week     Comment: rare  . Drug Use: No  . Sexual Activity: Not on file   Other Topics Concern  . Not on file   Social History Narrative   Caffeine daily   Review of Systems  Constitutional: Negative for fever and weight loss.  HENT: Negative for ear discharge, ear pain, hearing loss and tinnitus.   Eyes: Negative for blurred vision, double vision, photophobia and pain.  Respiratory: Negative for cough and shortness of breath.   Cardiovascular: Negative for chest pain and palpitations.  Gastrointestinal: Positive for heartburn. Negative  for nausea, vomiting, abdominal pain, diarrhea, constipation, blood in stool and melena.  Genitourinary: Negative for dysuria, urgency, frequency, hematuria and flank pain.  Musculoskeletal: Negative for falls.  Neurological: Negative for dizziness, loss of consciousness and headaches.  Endo/Heme/Allergies: Negative for environmental allergies.  Psychiatric/Behavioral: Negative for depression, suicidal ideas, hallucinations and substance abuse. The patient is nervous/anxious. The patient does not have insomnia.    BP 133/80 mmHg  Pulse 64  Temp(Src) 97.8 F (36.6 C) (Oral)  Resp 18  Ht 6' 5.5" (1.969 m)  Wt 222 lb (100.699  kg)  BMI 25.97 kg/m2  SpO2 99%  Physical Exam  Constitutional: He is oriented to person, place, and time and well-developed, well-nourished, and in no distress.  HENT:  Head: Normocephalic and atraumatic.  Right Ear: External ear normal.  Left Ear: External ear normal.  Nose: Nose normal.  Mouth/Throat: Oropharynx is clear and moist. No oropharyngeal exudate.  Eyes: Conjunctivae and EOM are normal. Pupils are equal, round, and reactive to light.  Neck: Neck supple. No thyromegaly present.  Cardiovascular: Normal rate, regular rhythm, normal heart sounds and intact distal pulses.   Pulmonary/Chest: Effort normal and breath sounds normal. No respiratory distress. He has no wheezes. He has no rales. He exhibits no tenderness.  Abdominal: Soft. Bowel sounds are normal. He exhibits no distension and no mass. There is no tenderness. There is no rebound and no guarding.  Genitourinary: Testes/scrotum normal.  Defers GU and DRE  Lymphadenopathy:    He has no cervical adenopathy.  Neurological: He is alert and oriented to person, place, and time.  Skin: Skin is warm and dry. No rash noted.  Psychiatric: Affect normal.  Vitals reviewed.   Recent Results (from the past 2160 hour(s))  Urinalysis, Routine w reflex microscopic     Status: Abnormal   Collection Time: 12/28/15  9:21 AM  Result Value Ref Range   Color, Urine YELLOW Yellow;Lt. Yellow   APPearance CLEAR Clear   Specific Gravity, Urine >=1.030 (A) 1.000 - 1.030   pH 5.5 5.0 - 8.0   Total Protein, Urine NEGATIVE Negative   Urine Glucose NEGATIVE Negative   Ketones, ur NEGATIVE Negative   Bilirubin Urine NEGATIVE Negative   Hgb urine dipstick NEGATIVE Negative   Urobilinogen, UA 0.2 0.0 - 1.0   Leukocytes, UA NEGATIVE Negative   Nitrite NEGATIVE Negative   WBC, UA 0-2/hpf 0-2/hpf   RBC / HPF 0-2/hpf 0-2/hpf   Mucus, UA Presence of (A) None   Granular Casts, UA Presence of (A) None    Assessment/Plan: Anxiety  state Handout on our counseling services given to patient. He agrees to set up appointment. Rx Ativan to use sparingly for intense anxiety. Increase exercise. Follow-up 1-2 months.  Prostate cancer screening Average risk. Asymptomatic. Defers DRE. Will proceed with PSA today.  Visit for preventive health examination Depression screen negative. Health Maintenance reviewed -- flu shot updated today. Referral placed to Gi for screening colonoscopy. Preventive schedule discussed and handout given in AVS. Will obtain fasting labs today.

## 2015-12-28 NOTE — Progress Notes (Signed)
Pre visit review using our clinic review tool, if applicable. No additional management support is needed unless otherwise documented below in the visit note. 

## 2015-12-28 NOTE — Assessment & Plan Note (Signed)
Depression screen negative. Health Maintenance reviewed -- flu shot updated today. Referral placed to Gi for screening colonoscopy. Preventive schedule discussed and handout given in AVS. Will obtain fasting labs today.

## 2015-12-30 ENCOUNTER — Other Ambulatory Visit: Payer: Self-pay | Admitting: Physician Assistant

## 2015-12-30 DIAGNOSIS — E860 Dehydration: Secondary | ICD-10-CM

## 2016-01-11 ENCOUNTER — Other Ambulatory Visit (INDEPENDENT_AMBULATORY_CARE_PROVIDER_SITE_OTHER): Payer: Managed Care, Other (non HMO)

## 2016-01-11 DIAGNOSIS — E860 Dehydration: Secondary | ICD-10-CM | POA: Diagnosis not present

## 2016-01-11 LAB — URINALYSIS
Bilirubin Urine: NEGATIVE
Ketones, ur: NEGATIVE
Leukocytes, UA: NEGATIVE
Nitrite: NEGATIVE
Specific Gravity, Urine: 1.025 (ref 1.000–1.030)
Total Protein, Urine: NEGATIVE
Urine Glucose: NEGATIVE
Urobilinogen, UA: 0.2 (ref 0.0–1.0)
pH: 6 (ref 5.0–8.0)

## 2016-02-19 ENCOUNTER — Ambulatory Visit: Payer: Managed Care, Other (non HMO) | Admitting: Physician Assistant

## 2016-02-22 ENCOUNTER — Ambulatory Visit: Payer: Managed Care, Other (non HMO) | Admitting: Physician Assistant

## 2016-02-24 ENCOUNTER — Encounter: Payer: Self-pay | Admitting: Physician Assistant

## 2016-02-24 ENCOUNTER — Ambulatory Visit (INDEPENDENT_AMBULATORY_CARE_PROVIDER_SITE_OTHER): Payer: Managed Care, Other (non HMO) | Admitting: Physician Assistant

## 2016-02-24 VITALS — BP 106/70 | HR 66 | Temp 97.9°F | Ht 77.5 in | Wt 217.4 lb

## 2016-02-24 DIAGNOSIS — F411 Generalized anxiety disorder: Secondary | ICD-10-CM

## 2016-02-24 NOTE — Progress Notes (Signed)
Pre visit review using our clinic review tool, if applicable. No additional management support is needed unless otherwise documented below in the visit note. 

## 2016-02-24 NOTE — Patient Instructions (Signed)
I am glad you are feeling better. We will stay off of medications for now. Keep up with diet and exercise. Call me if any anxiety recurs.

## 2016-02-24 NOTE — Progress Notes (Signed)
Patient presents to clinic today for follow-up of anxiety. Was prescribed Ativan and was to set up appointment with counseling. Patient took Ativan for a few days without improvement so stopped the medication. Has not scheduled appointment with counseling. Endorses now that work stressors have changed   Past Medical History  Diagnosis Date  . History of chicken pox     childhood  . Arthritis   . GERD (gastroesophageal reflux disease)     takes nexium  . Asthma     while doing cardio in cold weather, also with cigarette smoke  . Family history of adverse reaction to anesthesia     mom had  hallucinations after surgeries (in her 40's)    Current Outpatient Prescriptions on File Prior to Visit  Medication Sig Dispense Refill  . Esomeprazole Magnesium (NEXIUM PO) Take 1 capsule by mouth daily.    . Multiple Vitamin (MULTIVITAMIN WITH MINERALS) TABS tablet Take 1 tablet by mouth daily.     No current facility-administered medications on file prior to visit.    Allergies  Allergen Reactions  . Codeine Other (See Comments)    Tylenol #3 caused depression and anxiety  . Tramadol Other (See Comments)    Caused depression and anxiety    Family History  Problem Relation Age of Onset  . Heart disease Neg Hx   . Colon cancer Neg Hx   . Breast cancer Neg Hx   . Prostate cancer Father 28  . Diabetes Father   . Hypertension Mother   . Cancer Mother     pheochromocytoma  . Stroke Mother   . Multiple sclerosis Sister   . Cancer Sister     pheochromocytoma    Social History   Social History  . Marital Status: Married    Spouse Name: N/A  . Number of Children: N/A  . Years of Education: N/A   Social History Main Topics  . Smoking status: Never Smoker   . Smokeless tobacco: Never Used  . Alcohol Use: 0.0 oz/week    0 Standard drinks or equivalent per week     Comment: rare  . Drug Use: No  . Sexual Activity: Not Asked   Other Topics Concern  . None   Social History  Narrative   Caffeine daily   Review of Systems - See HPI.  All other ROS are negative.  BP 106/70 mmHg  Pulse 66  Temp(Src) 97.9 F (36.6 C) (Oral)  Ht 6' 5.5" (1.969 m)  Wt 217 lb 6.4 oz (98.612 kg)  BMI 25.44 kg/m2  SpO2 98%  Physical Exam  Constitutional: He is oriented to person, place, and time and well-developed, well-nourished, and in no distress.  HENT:  Head: Normocephalic and atraumatic.  Cardiovascular: Normal rate, regular rhythm, normal heart sounds and intact distal pulses.   Pulmonary/Chest: Effort normal and breath sounds normal. No respiratory distress. He has no wheezes. He has no rales. He exhibits no tenderness.  Neurological: He is alert and oriented to person, place, and time.  Skin: Skin is warm and dry. No rash noted.  Psychiatric: Affect normal.  Vitals reviewed.   Recent Results (from the past 2160 hour(s))  CBC     Status: None   Collection Time: 12/28/15  9:21 AM  Result Value Ref Range   WBC 8.1 4.0 - 10.5 K/uL   RBC 5.55 4.22 - 5.81 Mil/uL   Platelets 274.0 150.0 - 400.0 K/uL   Hemoglobin 15.1 13.0 - 17.0 g/dL   HCT  46.9 39.0 - 52.0 %   MCV 84.4 78.0 - 100.0 fl   MCHC 32.3 30.0 - 36.0 g/dL   RDW 13.6 11.5 - 15.5 %  Comp Met (CMET)     Status: Abnormal   Collection Time: 12/28/15  9:21 AM  Result Value Ref Range   Sodium 141 135 - 145 mEq/L   Potassium 3.8 3.5 - 5.1 mEq/L   Chloride 103 96 - 112 mEq/L   CO2 29 19 - 32 mEq/L   Glucose, Bld 102 (H) 70 - 99 mg/dL   BUN 16 6 - 23 mg/dL   Creatinine, Ser 1.00 0.40 - 1.50 mg/dL   Total Bilirubin 0.7 0.2 - 1.2 mg/dL   Alkaline Phosphatase 56 39 - 117 U/L   AST 16 0 - 37 U/L   ALT 21 0 - 53 U/L   Total Protein 7.5 6.0 - 8.3 g/dL   Albumin 4.6 3.5 - 5.2 g/dL   Calcium 9.6 8.4 - 10.5 mg/dL   GFR 83.52 >60.00 mL/min  TSH     Status: None   Collection Time: 12/28/15  9:21 AM  Result Value Ref Range   TSH 1.26 0.35 - 4.50 uIU/mL  Hemoglobin A1c     Status: None   Collection Time: 12/28/15   9:21 AM  Result Value Ref Range   Hgb A1c MFr Bld 6.1 4.6 - 6.5 %    Comment: Glycemic Control Guidelines for People with Diabetes:Non Diabetic:  <6%Goal of Therapy: <7%Additional Action Suggested:  >8%   Urinalysis, Routine w reflex microscopic     Status: Abnormal   Collection Time: 12/28/15  9:21 AM  Result Value Ref Range   Color, Urine YELLOW Yellow;Lt. Yellow   APPearance CLEAR Clear   Specific Gravity, Urine >=1.030 (A) 1.000 - 1.030   pH 5.5 5.0 - 8.0   Total Protein, Urine NEGATIVE Negative   Urine Glucose NEGATIVE Negative   Ketones, ur NEGATIVE Negative   Bilirubin Urine NEGATIVE Negative   Hgb urine dipstick NEGATIVE Negative   Urobilinogen, UA 0.2 0.0 - 1.0   Leukocytes, UA NEGATIVE Negative   Nitrite NEGATIVE Negative   WBC, UA 0-2/hpf 0-2/hpf   RBC / HPF 0-2/hpf 0-2/hpf   Mucus, UA Presence of (A) None   Granular Casts, UA Presence of (A) None  Lipid Profile     Status: Abnormal   Collection Time: 12/28/15  9:21 AM  Result Value Ref Range   Cholesterol 188 0 - 200 mg/dL    Comment: ATP III Classification       Desirable:  < 200 mg/dL               Borderline High:  200 - 239 mg/dL          High:  > = 240 mg/dL   Triglycerides 205.0 (H) 0.0 - 149.0 mg/dL    Comment: Normal:  <150 mg/dLBorderline High:  150 - 199 mg/dL   HDL 35.50 (L) >39.00 mg/dL   VLDL 41.0 (H) 0.0 - 40.0 mg/dL   Total CHOL/HDL Ratio 5     Comment:                Men          Women1/2 Average Risk     3.4          3.3Average Risk          5.0          4.42X Average Risk  9.6          7.13X Average Risk          15.0          11.0                       NonHDL 152.52     Comment: NOTE:  Non-HDL goal should be 30 mg/dL higher than patient's LDL goal (i.e. LDL goal of < 70 mg/dL, would have non-HDL goal of < 100 mg/dL)  PSA     Status: None   Collection Time: 12/28/15  9:21 AM  Result Value Ref Range   PSA 0.87 0.10 - 4.00 ng/mL  LDL cholesterol, direct     Status: None   Collection Time:  12/28/15  9:21 AM  Result Value Ref Range   Direct LDL 106.0 mg/dL    Comment: Optimal:  <100 mg/dLNear or Above Optimal:  100-129 mg/dLBorderline High:  130-159 mg/dLHigh:  160-189 mg/dLVery High:  >190 mg/dL  Urinalysis     Status: Abnormal   Collection Time: 01/11/16  8:33 AM  Result Value Ref Range   Color, Urine YELLOW Yellow;Lt. Yellow   APPearance CLEAR Clear   Specific Gravity, Urine 1.025 1.000-1.030   pH 6.0 5.0 - 8.0   Total Protein, Urine NEGATIVE Negative   Urine Glucose NEGATIVE Negative   Ketones, ur NEGATIVE Negative   Bilirubin Urine NEGATIVE Negative   Hgb urine dipstick TRACE-INTACT (A) Negative   Urobilinogen, UA 0.2 0.0 - 1.0   Leukocytes, UA NEGATIVE Negative   Nitrite NEGATIVE Negative    Assessment/Plan: Anxiety state Doing very well without medications. No current symptoms. Will continue to monitor.

## 2016-02-24 NOTE — Assessment & Plan Note (Signed)
Doing very well without medications. No current symptoms. Will continue to monitor.

## 2017-10-24 ENCOUNTER — Other Ambulatory Visit: Payer: Self-pay

## 2017-10-24 ENCOUNTER — Other Ambulatory Visit: Payer: Self-pay | Admitting: Physician Assistant

## 2017-10-24 ENCOUNTER — Ambulatory Visit (INDEPENDENT_AMBULATORY_CARE_PROVIDER_SITE_OTHER): Payer: 59 | Admitting: Physician Assistant

## 2017-10-24 ENCOUNTER — Encounter: Payer: Self-pay | Admitting: Physician Assistant

## 2017-10-24 VITALS — BP 118/72 | HR 59 | Temp 98.3°F | Resp 14 | Ht 77.5 in | Wt 220.0 lb

## 2017-10-24 DIAGNOSIS — Z23 Encounter for immunization: Secondary | ICD-10-CM

## 2017-10-24 DIAGNOSIS — H6981 Other specified disorders of Eustachian tube, right ear: Secondary | ICD-10-CM

## 2017-10-24 MED ORDER — FLUTICASONE PROPIONATE 50 MCG/ACT NA SUSP: 2.0000 | Freq: Every day | NASAL | 0 refills | Status: DC

## 2017-10-24 MED ORDER — MOMETASONE FUROATE 50 MCG/ACT NA SUSP: 2.0000 | Freq: Every day | NASAL | 0 refills | Status: DC

## 2017-10-24 NOTE — Progress Notes (Signed)
Pre visit review using our clinic review tool, if applicable. No additional management support is needed unless otherwise documented below in the visit note. 

## 2017-10-24 NOTE — Progress Notes (Signed)
Patient presents to clinic today c/o ear fullness and mild ache with pressure/popping. R ear affected only. Denies sinus pressure, sinus pain, fever, chills, malaise or fatigue. Denies change in hearing. Has history of cerumen impaction and states this feels similar to him.   Past Medical History:  Diagnosis Date  . Arthritis   . Asthma    while doing cardio in cold weather, also with cigarette smoke  . Family history of adverse reaction to anesthesia    mom had  hallucinations after surgeries (in her 7980's)  . GERD (gastroesophageal reflux disease)    takes nexium  . History of chicken pox    childhood    Current Outpatient Medications on File Prior to Visit  Medication Sig Dispense Refill  . Multiple Vitamin (MULTIVITAMIN WITH MINERALS) TABS tablet Take 1 tablet by mouth daily.     No current facility-administered medications on file prior to visit.     Allergies  Allergen Reactions  . Codeine Other (See Comments)    Tylenol #3 caused depression and anxiety  . Tramadol Other (See Comments)    Caused depression and anxiety    Family History  Problem Relation Age of Onset  . Prostate cancer Father [id number]  . Diabetes Father   . Hypertension Mother   . Cancer Mother        pheochromocytoma  . Stroke Mother   . Cancer Sister        pheochromocytoma  . Multiple sclerosis Sister   . Heart disease Neg Hx   . Colon cancer Neg Hx   . Breast cancer Neg Hx     Social History   Socioeconomic History  . Marital status: Married    Spouse name: None  . Number of children: None  . Years of education: None  . Highest education level: None  Social Needs  . Financial resource strain: None  . Food insecurity - worry: None  . Food insecurity - inability: None  . Transportation needs - medical: None  . Transportation needs - non-medical: None  Occupational History  . None  Tobacco Use  . Smoking status: Never Smoker  . Smokeless tobacco: Never Used  Substance and Sexual  Activity  . Alcohol use: Yes    Alcohol/week: 0.0 oz    Comment: rare  . Drug use: No  . Sexual activity: None  Other Topics Concern  . None  Social History Narrative   Caffeine daily   Review of Systems - See HPI.  All other ROS are negative.  BP 118/72   Pulse (!) 59   Temp 98.3 F (36.8 C) (Oral)   Resp 14   Ht 6' 5.5" (1.969 m)   Wt 220 lb (99.8 kg)   SpO2 95%   BMI 25.75 kg/m   Physical Exam  Constitutional: He is oriented to person, place, and time and well-developed, well-nourished, and in no distress.  HENT:  Head: Normocephalic and atraumatic.  Right Ear: Right ear middle ear effusion: scant serous fluid noted; hair strands in the ear canal.  Left Ear: Tympanic membrane normal.  Eyes: Conjunctivae are normal.  Neck: Neck supple.  Cardiovascular: Normal rate, regular rhythm, normal heart sounds and intact distal pulses.  Pulmonary/Chest: Effort normal and breath sounds normal. No respiratory distress. He has no wheezes. He has no rales. He exhibits no tenderness.  Neurological: He is alert and oriented to person, place, and time. No cranial nerve deficit.  Skin: Skin is warm and dry. No rash noted.  Psychiatric: Affect normal.  Vitals reviewed.  Assessment/Plan: 1. Need for immunization against influenza Flu shot given. - Flu Vaccine QUAD 36+ mos IM  2. Eustachian tube dysfunction, right Start Flonase. Hair strands removed with flushing. Supportive measures reviewed. Follow-up if not resolving.   Piedad ClimesWilliam Cody Cervando Durnin, PA-C

## 2017-10-24 NOTE — Patient Instructions (Signed)
Please use Flonase as directed.  This should help resolve pressure behind ear. Call me if symptoms not improving over the rest of the week.

## 2018-01-29 ENCOUNTER — Encounter: Payer: Self-pay | Admitting: Emergency Medicine

## 2018-02-12 ENCOUNTER — Encounter: Payer: Self-pay | Admitting: Emergency Medicine

## 2019-08-07 ENCOUNTER — Encounter (HOSPITAL_COMMUNITY): Payer: Self-pay | Admitting: Emergency Medicine

## 2019-08-07 ENCOUNTER — Emergency Department (HOSPITAL_COMMUNITY): Payer: 59

## 2019-08-07 ENCOUNTER — Emergency Department (HOSPITAL_COMMUNITY)
Admission: EM | Admit: 2019-08-07 | Discharge: 2019-08-08 | Disposition: A | Discharge status: Home / Self Care | Payer: 59 | Attending: Emergency Medicine | Admitting: Emergency Medicine

## 2019-08-07 DIAGNOSIS — R1013 Epigastric pain: Secondary | ICD-10-CM | POA: Diagnosis not present

## 2019-08-07 DIAGNOSIS — K8050 Calculus of bile duct without cholangitis or cholecystitis without obstruction: Secondary | ICD-10-CM | POA: Insufficient documentation

## 2019-08-07 DIAGNOSIS — R739 Hyperglycemia, unspecified: Secondary | ICD-10-CM

## 2019-08-07 DIAGNOSIS — J45909 Unspecified asthma, uncomplicated: Secondary | ICD-10-CM | POA: Diagnosis not present

## 2019-08-07 DIAGNOSIS — R112 Nausea with vomiting, unspecified: Secondary | ICD-10-CM | POA: Insufficient documentation

## 2019-08-07 DIAGNOSIS — R7989 Other specified abnormal findings of blood chemistry: Secondary | ICD-10-CM

## 2019-08-07 LAB — CBC
HCT: 47.1 % (ref 39.0–52.0)
Hemoglobin: 15 g/dL (ref 13.0–17.0)
MCH: 27.1 pg (ref 26.0–34.0)
MCHC: 31.8 g/dL (ref 30.0–36.0)
MCV: 85.2 fL (ref 80.0–100.0)
Platelets: 304 10*3/uL (ref 150–400)
RBC: 5.53 MIL/uL (ref 4.22–5.81)
RDW: 13.2 % (ref 11.5–15.5)
WBC: 7.3 10*3/uL (ref 4.0–10.5)
nRBC: 0 % (ref 0.0–0.2)

## 2019-08-07 LAB — COMPREHENSIVE METABOLIC PANEL
ALT: 77 U/L — ABNORMAL HIGH (ref 0–44)
AST: 158 U/L — ABNORMAL HIGH (ref 15–41)
Albumin: 4.2 g/dL (ref 3.5–5.0)
Alkaline Phosphatase: 67 U/L (ref 38–126)
Anion gap: 14 (ref 5–15)
BUN: 15 mg/dL (ref 6–20)
CO2: 22 mmol/L (ref 22–32)
Calcium: 9.3 mg/dL (ref 8.9–10.3)
Chloride: 103 mmol/L (ref 98–111)
Creatinine, Ser: 1.17 mg/dL (ref 0.61–1.24)
GFR calc Af Amer: >60 mL/min (ref >60)
GFR calc non Af Amer: >60 mL/min (ref >60)
Glucose, Bld: 155 mg/dL — ABNORMAL HIGH (ref 70–99)
Potassium: 3.5 mmol/L (ref 3.5–5.1)
Sodium: 139 mmol/L (ref 135–145)
Total Bilirubin: 1.2 mg/dL (ref 0.3–1.2)
Total Protein: 7.5 g/dL (ref 6.5–8.1)

## 2019-08-07 LAB — URINALYSIS, ROUTINE W REFLEX MICROSCOPIC
Bilirubin Urine: NEGATIVE
Glucose, UA: NEGATIVE mg/dL
Hgb urine dipstick: NEGATIVE
Ketones, ur: NEGATIVE mg/dL
Leukocytes,Ua: NEGATIVE
Nitrite: NEGATIVE
Protein, ur: 30 mg/dL — AB
Specific Gravity, Urine: 1.019 (ref 1.005–1.030)
pH: 7 (ref 5.0–8.0)

## 2019-08-07 LAB — TROPONIN I (HIGH SENSITIVITY): Troponin I (High Sensitivity): 4 ng/L (ref <18)

## 2019-08-07 LAB — LIPASE, BLOOD: Lipase: 50 U/L (ref 11–51)

## 2019-08-07 MED ORDER — SODIUM CHLORIDE 0.9 % IV BOLUS
1000.0000 mL | Freq: Once | INTRAVENOUS | Status: AC
  Administered 2019-08-08: 1000 mL via INTRAVENOUS

## 2019-08-07 MED ORDER — SODIUM CHLORIDE 0.9% FLUSH: 3.0000 mL | Freq: Once | INTRAVENOUS | Status: DC

## 2019-08-07 MED ORDER — ONDANSETRON HCL 4 MG/2ML IJ SOLN
4.0000 mg | Freq: Once | INTRAMUSCULAR | Status: AC
  Administered 2019-08-07: 4 mg via INTRAVENOUS
  Filled 2019-08-07: qty 2

## 2019-08-07 MED ORDER — MORPHINE SULFATE (PF) 4 MG/ML IV SOLN
4.0000 mg | Freq: Once | INTRAVENOUS | Status: AC
  Administered 2019-08-07: 4 mg via INTRAVENOUS
  Filled 2019-08-07: qty 1

## 2019-08-07 NOTE — ED Triage Notes (Signed)
Pt states after eating lunch he had extreme epigastric pain with nausea diaphoresis and vomitted once. Pt states the pain has eased off a lot but still remains in upper abd.

## 2019-08-07 NOTE — ED Provider Notes (Addendum)
[facility] EMERGENCY DEPARTMENT Provider Note   CSN: [id number] Arrival date & time: 08/07/19  1314   History   Chief Complaint Chief Complaint  Patient presents with  . Abdominal Pain   HPI Ronald Rios is a 53 y.o. male with history significant for asthma, GERD, anxiety who presents for evaluation of abdominal pain.  Patient states he is had intermittent abdominal pain since January.  States today while eating pizza for lunch he developed severe onset abdominal pain to his epigastric region.  Patient states the pain was so severe he became diaphoretic which caused him to vomit his lunch.  Patient states he has had persistent epigastric and left upper quadrant abdominal pain since lunch.  Patient states he normally only eats 1 large meal daily.  States he takes Nexium for his GERD which this does not feel like.  Denies chronic alcohol use, NSAID use. Social use of alcohol. Has history of pancreatitis.  Denies fever, chills, chest pain, shortness of breath, diarrhea, dysuria, constipation.  Rates his current pain a 7/10.  Denies additional aggravating or alleviating factors. Emesis NBNB. Tolerating PO intake at home. NO melena or hematochezia.  History obtained from patient and past medical records.  No interpreter was used.     HPI  Past Medical History:  Diagnosis Date  . Arthritis   . Asthma    while doing cardio in cold weather, also with cigarette smoke  . Family history of adverse reaction to anesthesia    mom had  hallucinations after surgeries (in her 18's)  . GERD (gastroesophageal reflux disease)    takes nexium  . History of chicken pox    childhood    Patient Active Problem List   Diagnosis Date Noted  . Prostate cancer screening 12/28/2015  . Visit for preventive health examination 12/28/2015  . Anxiety state 12/28/2015  . Esophageal reflux 06/23/2014  . Family history of cancer 05/05/2012    Past Surgical History:  Procedure Laterality Date   . ORIF CLAVICULAR FRACTURE Left 03/06/2015   Procedure: OPEN REDUCTION INTERNAL FIXATION (ORIF) LEFT CLAVICLE FRACTURE;  Surgeon: Leandrew Koyanagi, MD;  Location: [facility];  Service: Orthopedics;  Laterality: Left;  . SHOULDER SURGERY Right 10/2014  . WISDOM TOOTH EXTRACTION     had 3 removed.        Home Medications    Prior to Admission medications   Not on File    Family History Family History  Problem Relation Age of Onset  . Prostate cancer Father 3  . Diabetes Father   . Hypertension Mother   . Cancer Mother        pheochromocytoma  . Stroke Mother   . Cancer Sister        pheochromocytoma  . Multiple sclerosis Sister   . Heart disease Neg Hx   . Colon cancer Neg Hx   . Breast cancer Neg Hx     Social History Social History   Tobacco Use  . Smoking status: Never Smoker  . Smokeless tobacco: Never Used  Substance Use Topics  . Alcohol use: Yes    Alcohol/week: 0.0 standard drinks    Comment: rare  . Drug use: No    Allergies   Codeine and Tramadol   Review of Systems Review of Systems  Constitutional: Negative.   HENT: Negative.   Respiratory: Negative.   Cardiovascular: Negative.   Gastrointestinal: Positive for abdominal pain, nausea and vomiting. Negative for anal bleeding, blood in stool, constipation, diarrhea  and rectal pain.  Musculoskeletal: Negative.   Skin: Negative.   Neurological: Negative.        Diaphoresis  All other systems reviewed and are negative.   Physical Exam Updated Vital Signs BP (!) 151/74 (BP Location: Right Arm)   Pulse 65   Temp 98.9 F (37.2 C) (Oral)   Resp 16   SpO2 98%   Physical Exam Vitals signs and nursing note reviewed.  Constitutional:      General: He is not in acute distress.    Appearance: He is well-developed. He is not ill-appearing, toxic-appearing or diaphoretic.  HENT:     Head: Normocephalic and atraumatic.     Mouth/Throat:     Mouth: Mucous membranes are moist.     Pharynx: Oropharynx is  clear.  Eyes:     Pupils: Pupils are equal, round, and reactive to light.  Neck:     Musculoskeletal: Normal range of motion and neck supple.  Cardiovascular:     Rate and Rhythm: Normal rate and regular rhythm.     Heart sounds: Normal heart sounds.  Pulmonary:     Effort: Pulmonary effort is normal. No respiratory distress.     Breath sounds: Normal breath sounds.  Chest:     Comments: No chest wall tenderness palpation.  No rashes, lesions, crepitus or deformity. Abdominal:     General: Bowel sounds are normal. There is no distension.     Palpations: Abdomen is soft.     Tenderness: There is abdominal tenderness in the epigastric area. There is no right CVA tenderness, left CVA tenderness, guarding or rebound. Negative signs include Murphy's sign.     Hernia: No hernia is present.     Comments: Mild tenderness to epigastric region.  Negative Murphy sign.  No rebound or guarding.  Musculoskeletal: Normal range of motion.  Skin:    General: Skin is warm and dry.     Comments: No vesicular lesions  Neurological:     Mental Status: He is alert.     Comments: Cranial nerves II through XII grossly intact.  No facial droop.    ED Treatments / Results  Labs (all labs ordered are listed, but only abnormal results are displayed) Labs Reviewed  COMPREHENSIVE METABOLIC PANEL - Abnormal; Notable for the following components:      Result Value   Glucose, Bld 155 (*)    AST 158 (*)    ALT 77 (*)    All other components within normal limits  URINALYSIS, ROUTINE W REFLEX MICROSCOPIC - Abnormal; Notable for the following components:   Color, Urine AMBER (*)    APPearance CLOUDY (*)    Protein, ur 30 (*)    Bacteria, UA RARE (*)    All other components within normal limits  LIPASE, BLOOD  CBC  TROPONIN I (HIGH SENSITIVITY)  TROPONIN I (HIGH SENSITIVITY)    EKG EKG Interpretation  Date/Time:  Wednesday August 07 2019 13:23:30 EDT Ventricular Rate:  67 PR Interval:  174 QRS  Duration: 102 QT Interval:  410 QTC Calculation: 433 R Axis:   36 Text Interpretation:  Normal sinus rhythm Normal ECG No old tracing to compare Confirmed by Deno Etienne [id number]) on 08/07/2019 9:37:46 PM   Radiology Dg Abdomen Acute W/chest  Result Date: 08/07/2019 CLINICAL DATA:  Upper abdominal pain. EXAM: DG ABDOMEN ACUTE W/ 1V CHEST COMPARISON:  Report from CT 07/12/2012, images not available FINDINGS: Streaky bibasilar opacities. Heart is normal in size. No pulmonary edema or pleural fluid.  Remote left clavicle fixation. No free air. Air-fluid level in the stomach which appears mildly distended. No small bowel dilatation. Small to moderate stool in the colon. EKG leads overlie the abdomen. Calcifications in the pelvis are likely phleboliths. No radiopaque calculi project over the renal beds or course of the ureters. Mild scoliosis and degenerative change in the spine. IMPRESSION: 1. Gaseous gastric distension with air-fluid level, nonspecific but may represent gastroenteritis. No small bowel dilatation or obstruction. 2. Streaky bibasilar atelectasis. Electronically Signed   By: Keith Rake M.D.   On: 08/07/2019 22:27    Procedures Procedures (including critical care time)  Medications Ordered in ED Medications  sodium chloride flush (NS) 0.9 % injection 3 mL (has no administration in time range)  sodium chloride 0.9 % bolus 1,000 mL (has no administration in time range)  ondansetron (ZOFRAN) injection 4 mg (4 mg Intravenous Given 08/07/19 2319)  morphine 4 MG/ML injection 4 mg (4 mg Intravenous Given 08/07/19 2319)   Initial Impression / Assessment and Plan / ED Course  I have reviewed the triage vital signs and the nursing notes.  Pertinent labs & imaging results that were available during my care of the patient were reviewed by me and considered in my medical decision making (see chart for details).  53 year old male appears otherwise well presents for evaluation of abdominal pain.   He is afebrile, nonseptic, non-ill-appearing.  Patient with severe episode of abdominal pain earlier today after eating pizza which caused him to have one episode of emesis and he became diaphoretic.  Denies any chest pain or shortness of breath.  Has had intermittent abdominal pain since January however he is unsure if this is related to food.  Diaphoresis and nausea resolved PTA however still has pain located to his epigastric and left upper quadrant.  He has a negative Murphy sign.  He has mild tenderness to his epigastric region without rebound or guarding.  Heart and lungs clear.  No chest wall tenderness palpation.  Labs obtained from triage. Low suspicion for ACS as etiology however will obtain troponin given diaphoresis, emesis and acute pain.  CBC without leukocytosis Metabolic panel with mild elevation in glucose at 155, elevation in LFTs without elevation in alk phos and bilirubin. Low suspicion for choledocholithiasis or cholangitis. Lipase 50 Trop pending Urinalysis without evidence of infection EKG without ST/T changes. No STEMI. Dg chest/abd with gaseous gastric distension with air-fluid level, nonspecific but may represent gastroenteritis. No small bowel dilatation or obstruction.  [place] pending  Provide pain management, IVF, antiemetics.  Care transferred to [facility], [place] who will follow up on Troponin, [place] and reevaluate pain management. Disposition per Deborah Chalk, PA-C      Final Clinical Impressions(s) / ED Diagnoses   Final diagnoses:  Epigastric pain  Elevated LFTs  Non-intractable vomiting with nausea, unspecified vomiting type  Blood glucose elevated    ED Discharge Orders    None       Sylvia Kondracki A, PA-C 08/07/19 2338    Damion Kant A, PA-C 08/07/19 2339    Myleka Moncure A, PA-C 08/07/19 Hancock, Dan, DO 08/12/19 1506

## 2019-08-08 ENCOUNTER — Encounter: Payer: Self-pay | Admitting: Physician Assistant

## 2019-08-08 MED ORDER — HYDROCODONE-ACETAMINOPHEN 5-325 MG PO TABS: 1.0000 | ORAL_TABLET | Freq: Four times a day (QID) | ORAL | 0 refills | Status: AC | PRN

## 2019-08-08 MED ORDER — METOCLOPRAMIDE HCL 10 MG PO TABS: 10.0000 mg | ORAL_TABLET | Freq: Four times a day (QID) | ORAL | 0 refills | Status: AC | PRN

## 2019-08-08 NOTE — Discharge Instructions (Signed)
You are found to have gallstones.  This can cause recurrent pain after eating, especially if eating fried, fatty, greasy foods.  You may take Norco as needed for any persistent pain.  Use Reglan for management of nausea.  Follow-up with general surgery as well as your primary care doctor.  You may return for any new or concerning symptoms.

## 2019-08-08 NOTE — ED Provider Notes (Signed)
12:47 AM Patient care assumed from tenderly, PA-C at change of shift.  In short, patient is a 53 year old male with a history of esophageal reflux and anxiety presenting to the emergency department for abdominal pain.  Pain began while eating pizza for lunch.  He has had history of similar recurrent postprandial pain.  Work-up pending at shift change.  This has been reviewed.  His ultrasound shows cholelithiasis without evidence of acute cholecystitis.  He does have mild LFT elevation which can be explained by biliary colic.  Presently, the patient is asymptomatic; states that he has no pain.  He is afebrile.  There is no leukocytosis.  A troponin was ordered to exclude atypical cardiac etiology.  Troponin is negative.  Plan for discharge with referral to general surgery.  He was given a prescription for pain medication as well as antiemetics.  Return precautions discussed and provided. Patient discharged in stable condition with no unaddressed concerns.  Vitals:   08/07/19 1753 08/07/19 2145 08/08/19 0000 08/08/19 0015  BP: (!) 146/76 (!) 151/74 117/76 121/76  Pulse: (!) 57 65 60 63  Resp: 19 16 14 16   Temp:      TempSrc:      SpO2: 100% 98% 96% 96%    Results for orders placed or performed during the hospital encounter of 08/07/19  Lipase, blood  Result Value Ref Range   Lipase 50 11 - 51 U/L  Comprehensive metabolic panel  Result Value Ref Range   Sodium 139 135 - 145 mmol/L   Potassium 3.5 3.5 - 5.1 mmol/L   Chloride 103 98 - 111 mmol/L   CO2 22 22 - 32 mmol/L   Glucose, Bld 155 (H) 70 - 99 mg/dL   BUN 15 6 - 20 mg/dL   Creatinine, Ser 6.961.17 0.61 - 1.24 mg/dL   Calcium 9.3 8.9 - 29.510.3 mg/dL   Total Protein 7.5 6.5 - 8.1 g/dL   Albumin 4.2 3.5 - 5.0 g/dL   AST 284158 (H) 15 - 41 U/L   ALT 77 (H) 0 - 44 U/L   Alkaline Phosphatase 67 38 - 126 U/L   Total Bilirubin 1.2 0.3 - 1.2 mg/dL   GFR calc non Af Amer >60 >60 mL/min   GFR calc Af Amer >60 >60 mL/min   Anion gap 14 5 - 15  CBC   Result Value Ref Range   WBC 7.3 4.0 - 10.5 K/uL   RBC 5.53 4.22 - 5.81 MIL/uL   Hemoglobin 15.0 13.0 - 17.0 g/dL   HCT 13.247.1 44.039.0 - 10.252.0 %   MCV 85.2 80.0 - 100.0 fL   MCH 27.1 26.0 - 34.0 pg   MCHC 31.8 30.0 - 36.0 g/dL   RDW 72.513.2 36.611.5 - 44.015.5 %   Platelets 304 150 - 400 K/uL   nRBC 0.0 0.0 - 0.2 %  Urinalysis, Routine w reflex microscopic  Result Value Ref Range   Color, Urine AMBER (A) YELLOW   APPearance CLOUDY (A) CLEAR   Specific Gravity, Urine 1.019 1.005 - 1.030   pH 7.0 5.0 - 8.0   Glucose, UA NEGATIVE NEGATIVE mg/dL   Hgb urine dipstick NEGATIVE NEGATIVE   Bilirubin Urine NEGATIVE NEGATIVE   Ketones, ur NEGATIVE NEGATIVE mg/dL   Protein, ur 30 (A) NEGATIVE mg/dL   Nitrite NEGATIVE NEGATIVE   Leukocytes,Ua NEGATIVE NEGATIVE   RBC / HPF 0-5 0 - 5 RBC/hpf   WBC, UA 0-5 0 - 5 WBC/hpf   Bacteria, UA RARE (A) NONE SEEN  Amorphous Crystal PRESENT   Troponin I (High Sensitivity)  Result Value Ref Range   Troponin I (High Sensitivity) 4 <18 ng/L   Dg Abdomen Acute W/chest  Result Date: 08/07/2019 CLINICAL DATA:  Upper abdominal pain. EXAM: DG ABDOMEN ACUTE W/ 1V CHEST COMPARISON:  Report from CT 07/12/2012, images not available FINDINGS: Streaky bibasilar opacities. Heart is normal in size. No pulmonary edema or pleural fluid. Remote left clavicle fixation. No free air. Air-fluid level in the stomach which appears mildly distended. No small bowel dilatation. Small to moderate stool in the colon. EKG leads overlie the abdomen. Calcifications in the pelvis are likely phleboliths. No radiopaque calculi project over the renal beds or course of the ureters. Mild scoliosis and degenerative change in the spine. IMPRESSION: 1. Gaseous gastric distension with air-fluid level, nonspecific but may represent gastroenteritis. No small bowel dilatation or obstruction. 2. Streaky bibasilar atelectasis. Electronically Signed   By: Keith Rake M.D.   On: 08/07/2019 22:27   US Abdomen  Limited Ruq  Result Date: 08/07/2019 CLINICAL DATA:  Epigastric pain EXAM: ULTRASOUND ABDOMEN LIMITED RIGHT UPPER QUADRANT COMPARISON:  None. FINDINGS: Gallbladder: There is a single large gallstone measuring at least 3.5 cm. There is no gallbladder wall thickening. The sonographic Percell Miller sign is negative. There is no pericholecystic free fluid. Common bile duct: Diameter: 5 mm Liver: Diffuse increased echogenicity with slightly heterogeneous liver. Appearance typically secondary to fatty infiltration. Fibrosis secondary consideration. No secondary findings of cirrhosis noted. No focal hepatic lesion or intrahepatic biliary duct dilatation. Portal vein is patent on color Doppler imaging with normal direction of blood flow towards the liver. Other: None. IMPRESSION: 1. There is cholelithiasis without secondary signs of acute cholecystitis. 2. Hepatic steatosis. Electronically Signed   By: Constance Holster M.D.   On: 08/07/2019 23:46      Antonietta Breach, PA-C 08/08/19 9528    Varney Biles, MD 08/08/19 4132

## 2019-08-08 NOTE — ED Notes (Signed)
Patient verbalizes understanding of discharge instructions. Opportunity for questioning and answers were provided. Armband removed by staff, pt discharged from ED.  

## 2020-02-27 ENCOUNTER — Ambulatory Visit: Payer: Self-pay | Admitting: *Deleted

## 2020-02-27 NOTE — Telephone Encounter (Signed)
Patient with N/V and diarrhea over the last 2 days. Possible low grade fever also. Feeling some better today able to hold down water and banana. Has 1st Covid 19 vaccine tomorrow. Advised rescheduling appointment until 3 days without any symptoms. Cancelled Covid vaccine appointment for tomorrow.

## 2020-02-28 ENCOUNTER — Ambulatory Visit: Payer: 59

## 2021-03-24 ENCOUNTER — Telehealth: Payer: Self-pay

## 2021-03-24 NOTE — Telephone Encounter (Signed)
LVM advising patient to call back and schedule TOC. 

## 2021-08-04 IMAGING — DX DG ABDOMEN ACUTE W/ 1V CHEST
4 series · 4 of 4 positions shown · non-contrast
Comparison: Report from CT 07/12/2012, images not available

CLINICAL DATA: Upper abdominal pain.

EXAM:
DG ABDOMEN ACUTE W/ 1V CHEST

[chest pa]
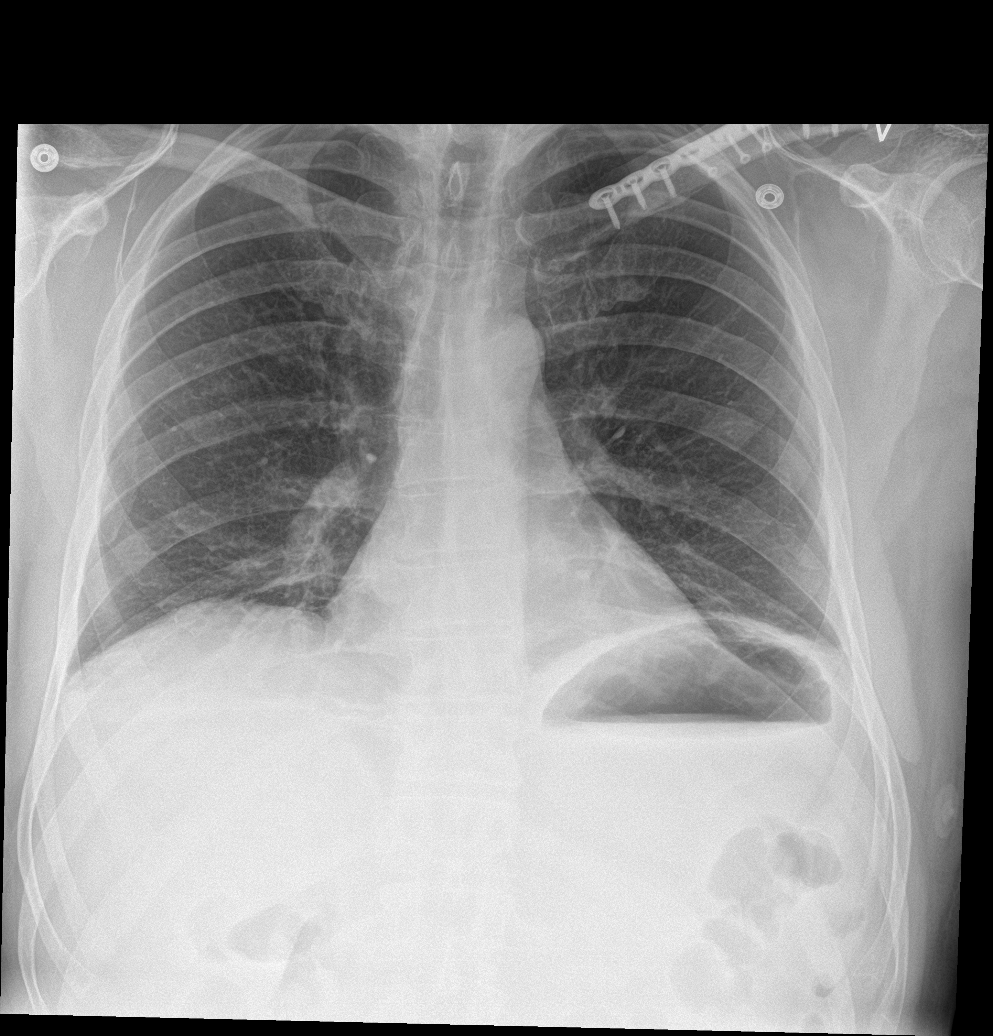

[abdomen erect]
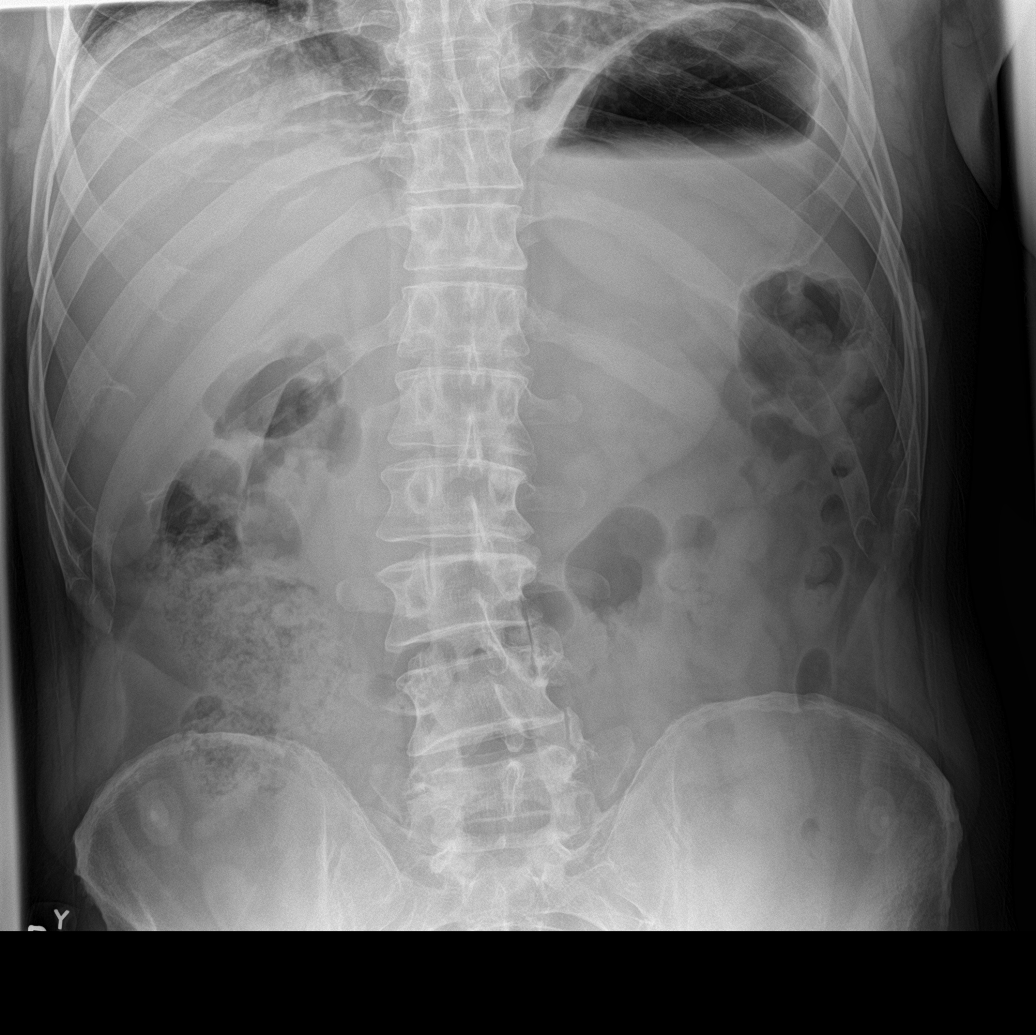

[abdomen supine (1 of 2)]
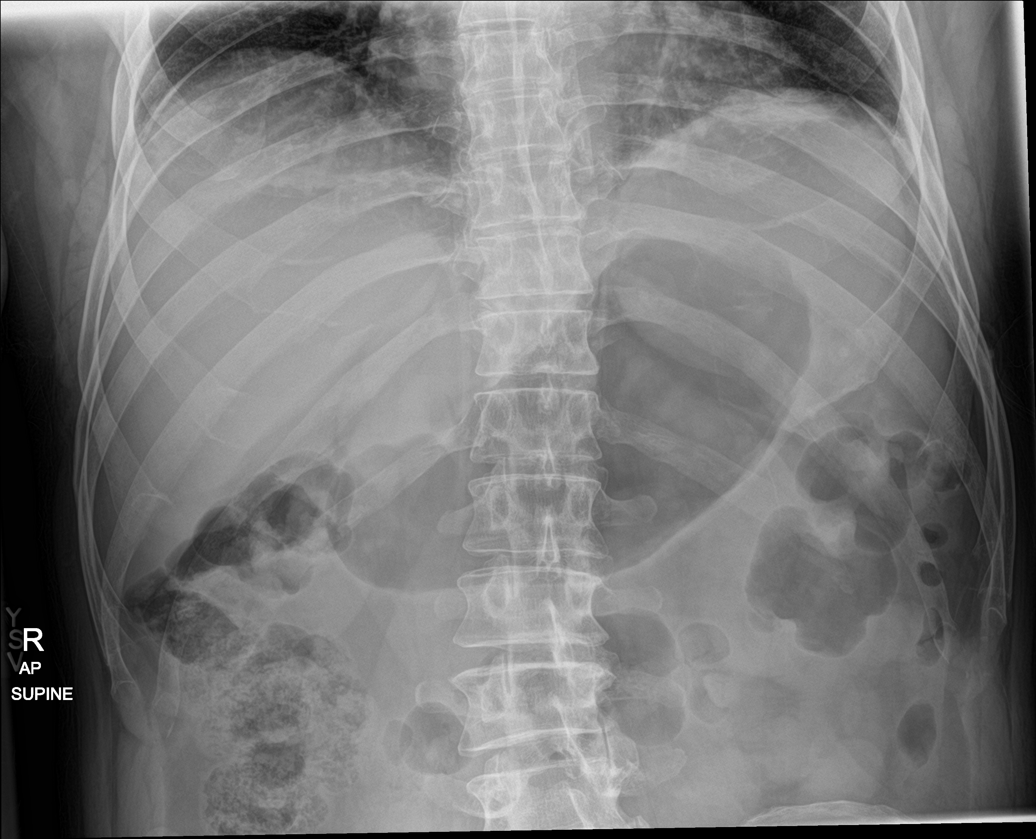

[abdomen supine (2 of 2)]
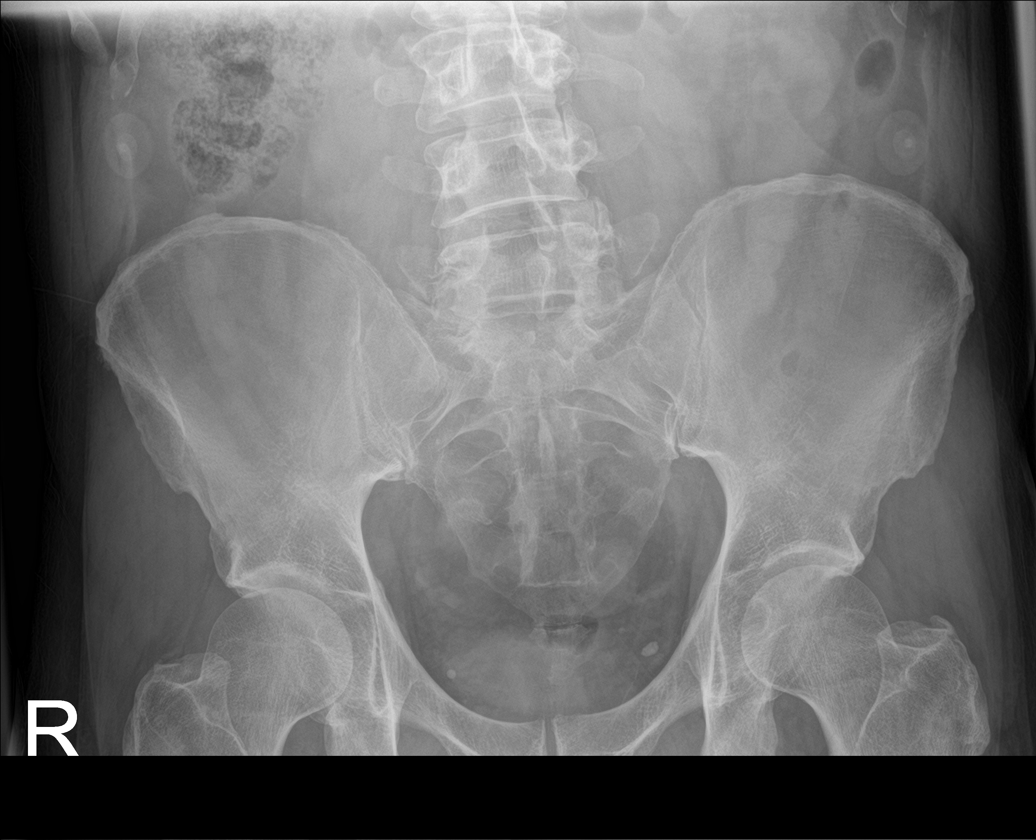

[4 of 4 positions shown; findings below may reference images not displayed]

FINDINGS: Streaky bibasilar opacities. Heart is normal in size. No pulmonary
edema or pleural fluid. Remote left clavicle fixation.

No free air. Air-fluid level in the stomach which appears mildly
distended. No small bowel dilatation. Small to moderate stool in the
colon. EKG leads overlie the abdomen. Calcifications in the pelvis
are likely phleboliths. No radiopaque calculi project over the renal
beds or course of the ureters. Mild scoliosis and degenerative
change in the spine.
IMPRESSION: 1. Gaseous gastric distension with air-fluid level, nonspecific but
may represent gastroenteritis. No small bowel dilatation or
obstruction.
2. Streaky bibasilar atelectasis.

## 2021-08-04 IMAGING — US US ABDOMEN LIMITED
1 series · 14 of 25 positions shown · non-contrast
Comparison: None.

CLINICAL DATA: Epigastric pain

EXAM:
ULTRASOUND ABDOMEN LIMITED RIGHT UPPER QUADRANT

[Series 1: us abdomen limited · 14 of 28 slices shown]
[im 1/28]
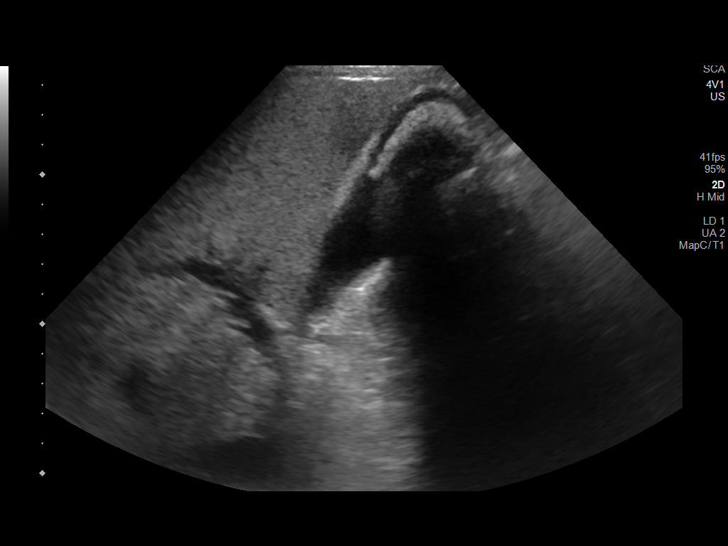
[im 3/28]
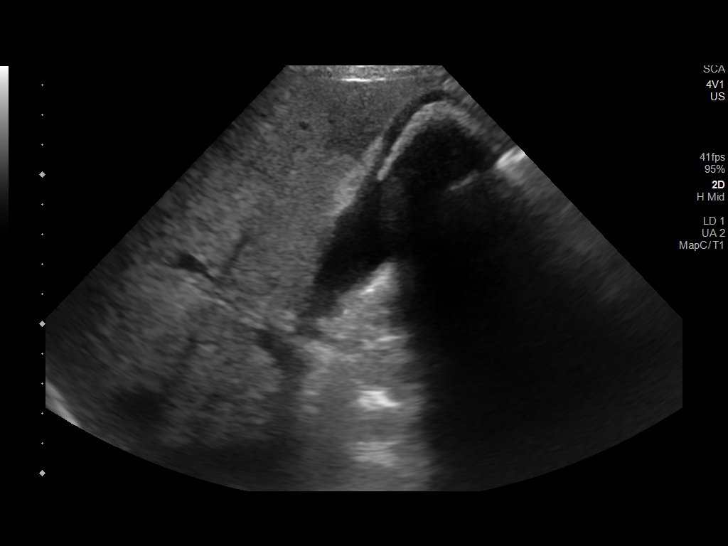
[im 5/28]
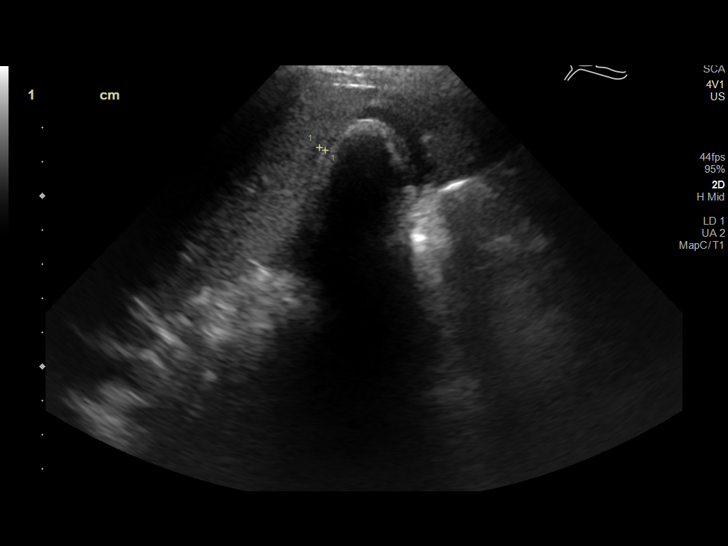
[im 7/28]
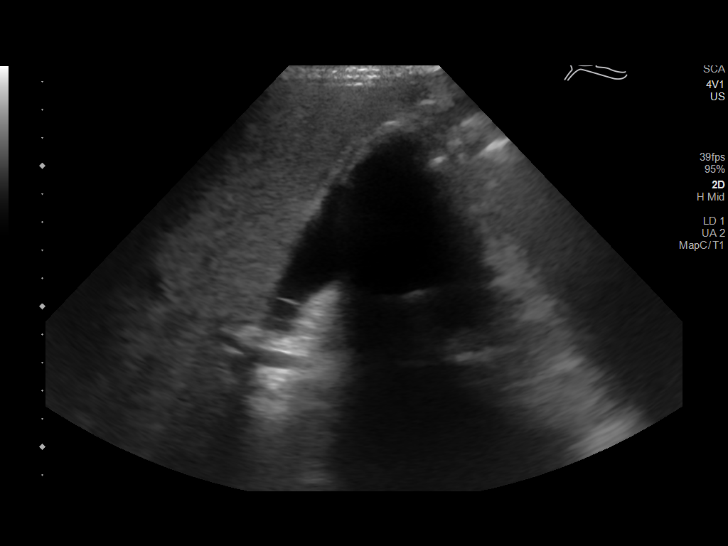
[im 10/28]
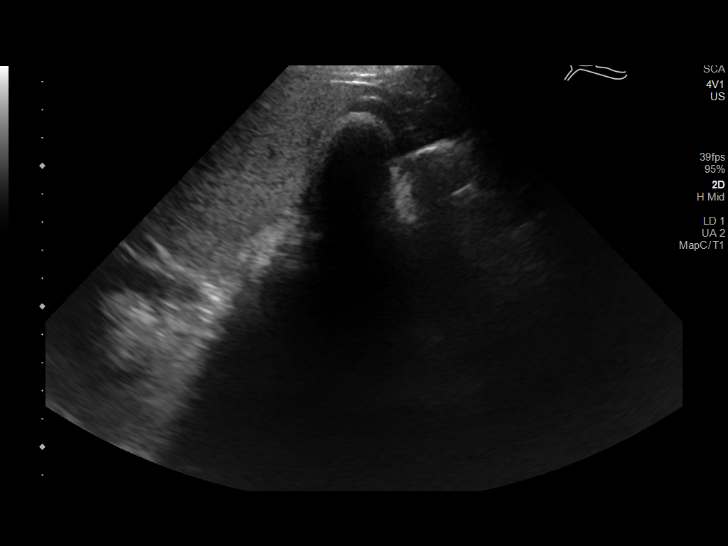
[im 11/28]
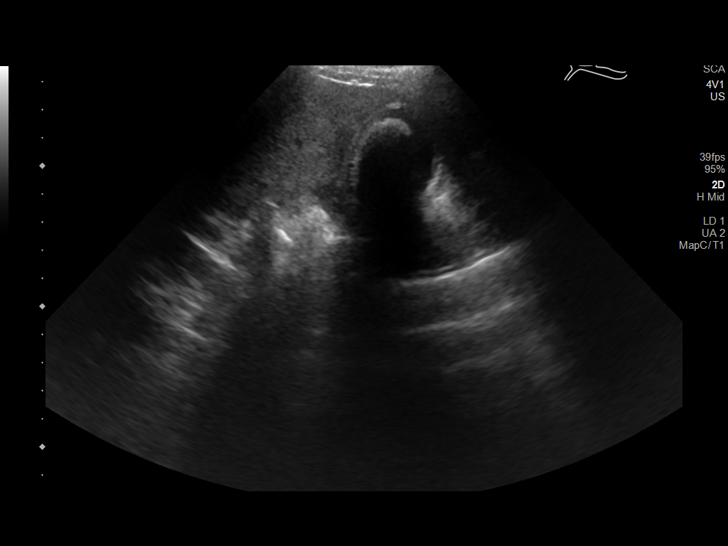
[im 13/28]
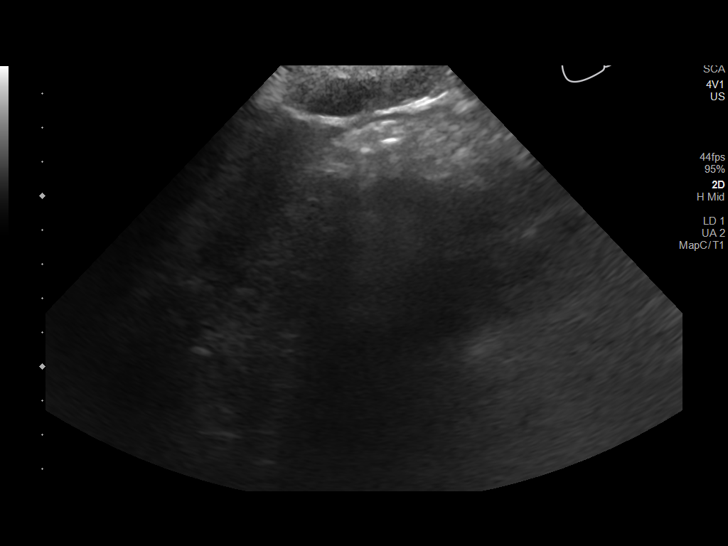
[im 15/28]
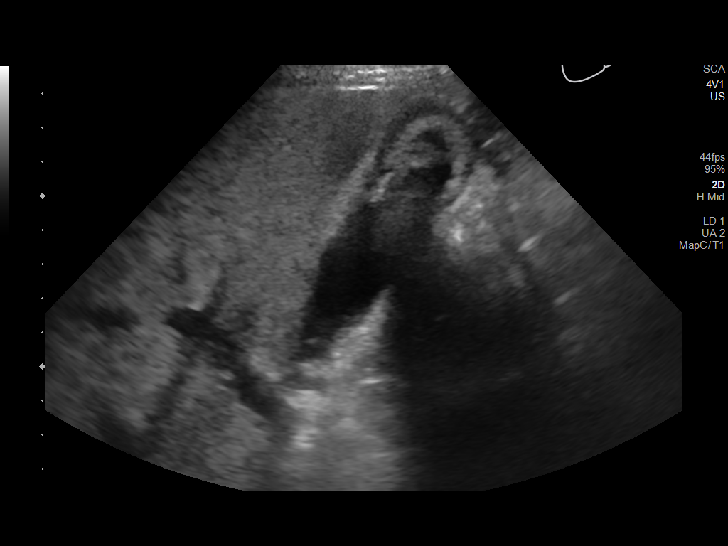
[im 17/28]
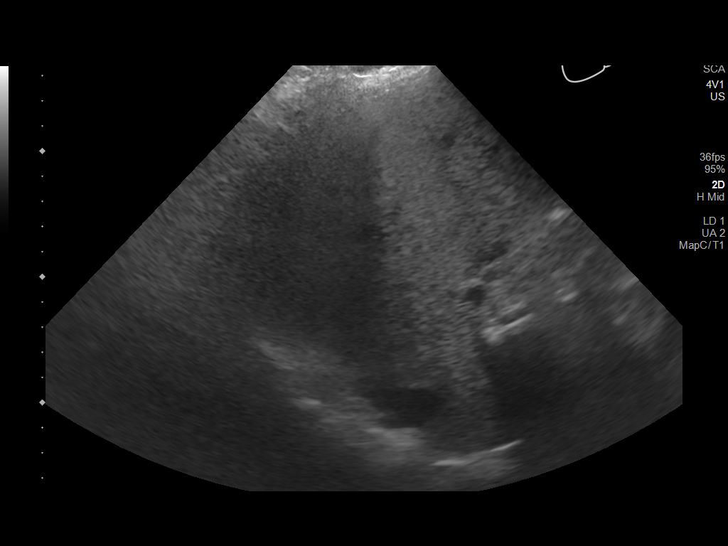
[im 19/28]
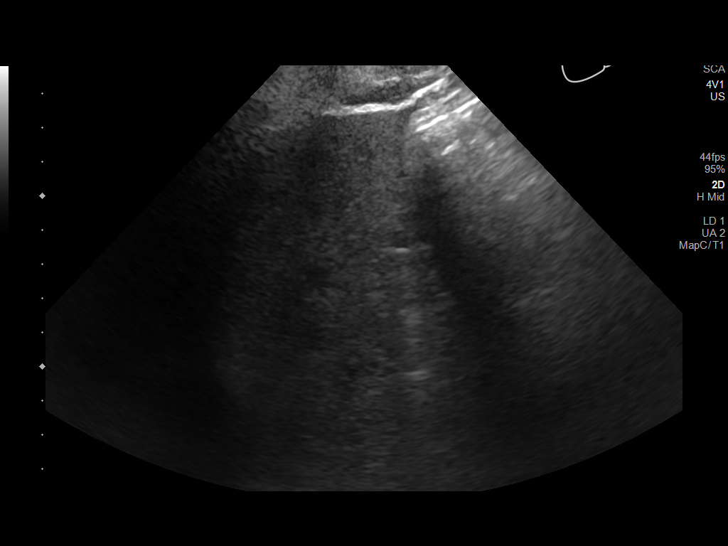
[im 21/28]
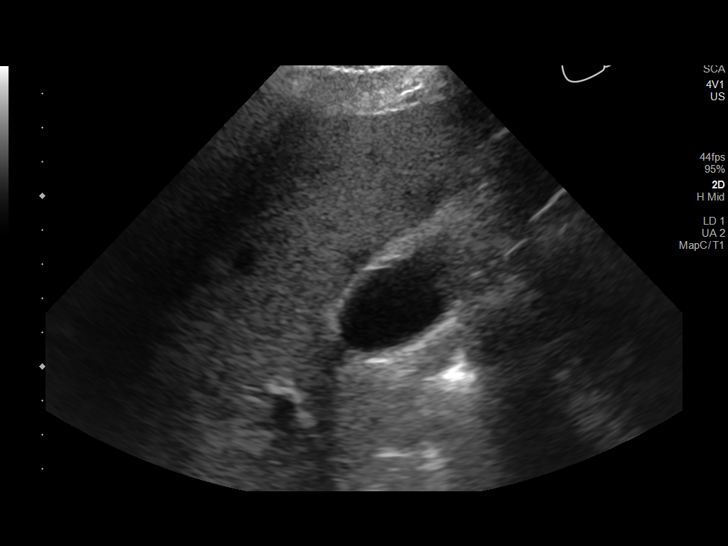
[im 23/28]
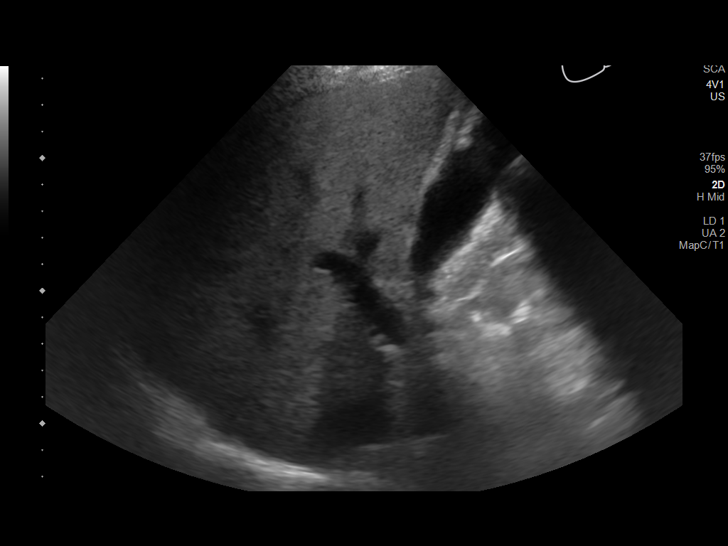
[im 25/28]
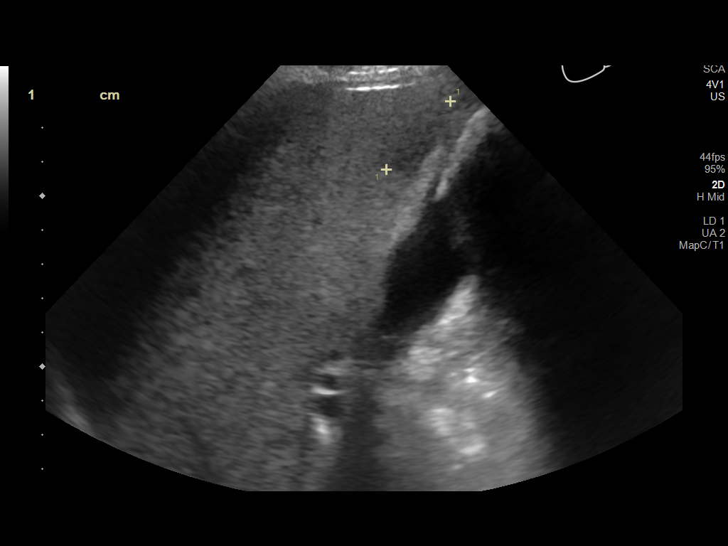
[im 28/28]
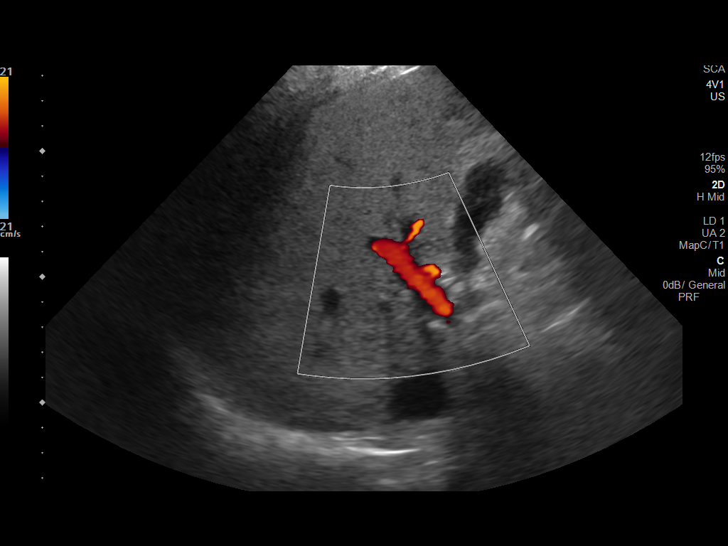

[14 of 25 positions shown; findings below may reference images not displayed]

FINDINGS: Gallbladder:

There is a single large gallstone measuring at least 3.5 cm. There
is no gallbladder wall thickening. The sonographic Murphy sign is
negative. There is no pericholecystic free fluid.

Common bile duct:

Diameter: 5 mm

Liver:

Diffuse increased echogenicity with slightly heterogeneous liver.
Appearance typically secondary to fatty infiltration. Fibrosis
secondary consideration. No secondary findings of cirrhosis noted.
No focal hepatic lesion or intrahepatic biliary duct dilatation.
Portal vein is patent on color Doppler imaging with normal direction
of blood flow towards the liver.

Other: None.
IMPRESSION: 1. There is cholelithiasis without secondary signs of acute
cholecystitis.
2. Hepatic steatosis.
